# Patient Record
Sex: Female | Born: 1950
Health system: Southern US, Community
[De-identification: ages and names within clinical notes are randomized; demographics above are authoritative.]

## PROBLEM LIST (undated history)

## (undated) DIAGNOSIS — M1990 Unspecified osteoarthritis, unspecified site: Secondary | ICD-10-CM

## (undated) DIAGNOSIS — G8929 Other chronic pain: Secondary | ICD-10-CM

## (undated) DIAGNOSIS — S1980XA Other specified injuries of unspecified part of neck, initial encounter: Secondary | ICD-10-CM

## (undated) DIAGNOSIS — G5600 Carpal tunnel syndrome, unspecified upper limb: Secondary | ICD-10-CM

## (undated) DIAGNOSIS — R87619 Unspecified abnormal cytological findings in specimens from cervix uteri: Secondary | ICD-10-CM

## (undated) DIAGNOSIS — R251 Tremor, unspecified: Secondary | ICD-10-CM

## (undated) DIAGNOSIS — I10 Essential (primary) hypertension: Secondary | ICD-10-CM

## (undated) DIAGNOSIS — R000 Tachycardia, unspecified: Secondary | ICD-10-CM

## (undated) DIAGNOSIS — Z8619 Personal history of other infectious and parasitic diseases: Secondary | ICD-10-CM

## (undated) DIAGNOSIS — R609 Edema, unspecified: Secondary | ICD-10-CM

## (undated) DIAGNOSIS — F419 Anxiety disorder, unspecified: Secondary | ICD-10-CM

## (undated) DIAGNOSIS — M5126 Other intervertebral disc displacement, lumbar region: Secondary | ICD-10-CM

## (undated) DIAGNOSIS — IMO0002 Reserved for concepts with insufficient information to code with codable children: Secondary | ICD-10-CM

## (undated) DIAGNOSIS — K219 Gastro-esophageal reflux disease without esophagitis: Secondary | ICD-10-CM

## (undated) HISTORY — PX: UPPER GI ENDOSCOPY: SHX6162

## (undated) HISTORY — DX: Other specified injuries of unspecified part of neck, initial encounter: S19.80XA

## (undated) HISTORY — DX: Other chronic pain: G89.29

## (undated) HISTORY — PX: COLONOSCOPY: SHX174

## (undated) HISTORY — DX: Essential (primary) hypertension: I10

## (undated) HISTORY — DX: Carpal tunnel syndrome, unspecified upper limb: G56.00

## (undated) HISTORY — PX: OTHER SURGICAL HISTORY: SHX169

## (undated) HISTORY — DX: Personal history of other infectious and parasitic diseases: Z86.19

## (undated) HISTORY — DX: Unspecified abnormal cytological findings in specimens from cervix uteri: R87.619

## (undated) HISTORY — DX: Tachycardia, unspecified: R00.0

## (undated) HISTORY — DX: Edema, unspecified: R60.9

## (undated) HISTORY — DX: Reserved for concepts with insufficient information to code with codable children: IMO0002

## (undated) HISTORY — DX: Other intervertebral disc displacement, lumbar region: M51.26

---

## 2000-05-02 ENCOUNTER — Other Ambulatory Visit: Admission: RE | Admit: 2000-05-02 | Discharge: 2000-05-02 | Payer: Self-pay | Admitting: Gynecology

## 2000-11-21 ENCOUNTER — Emergency Department (HOSPITAL_COMMUNITY): Admission: EM | Admit: 2000-11-21 | Discharge: 2000-11-21 | Payer: Self-pay | Admitting: Emergency Medicine

## 2001-01-16 ENCOUNTER — Emergency Department (HOSPITAL_COMMUNITY): Admission: EM | Admit: 2001-01-16 | Discharge: 2001-01-16 | Payer: Self-pay | Admitting: Emergency Medicine

## 2001-01-30 ENCOUNTER — Emergency Department (HOSPITAL_COMMUNITY): Admission: EM | Admit: 2001-01-30 | Discharge: 2001-01-30 | Payer: Self-pay | Admitting: Emergency Medicine

## 2001-01-30 ENCOUNTER — Encounter: Payer: Self-pay | Admitting: Emergency Medicine

## 2001-02-06 ENCOUNTER — Other Ambulatory Visit: Admission: RE | Admit: 2001-02-06 | Discharge: 2001-02-06 | Payer: Self-pay | Admitting: Gastroenterology

## 2001-02-06 ENCOUNTER — Encounter (INDEPENDENT_AMBULATORY_CARE_PROVIDER_SITE_OTHER): Payer: Self-pay | Admitting: Specialist

## 2001-04-02 ENCOUNTER — Encounter: Payer: Self-pay | Admitting: Gastroenterology

## 2001-04-02 ENCOUNTER — Ambulatory Visit (HOSPITAL_COMMUNITY): Admission: RE | Admit: 2001-04-02 | Discharge: 2001-04-02 | Payer: Self-pay | Admitting: Gastroenterology

## 2001-04-16 ENCOUNTER — Encounter: Payer: Self-pay | Admitting: Psychology

## 2001-04-16 ENCOUNTER — Inpatient Hospital Stay (HOSPITAL_COMMUNITY): Admission: EM | Admit: 2001-04-16 | Discharge: 2001-04-17 | Payer: Self-pay | Admitting: Emergency Medicine

## 2001-05-20 ENCOUNTER — Emergency Department (HOSPITAL_COMMUNITY): Admission: EM | Admit: 2001-05-20 | Discharge: 2001-05-20 | Payer: Self-pay | Admitting: Internal Medicine

## 2001-05-20 ENCOUNTER — Encounter: Payer: Self-pay | Admitting: Gastroenterology

## 2002-08-28 ENCOUNTER — Other Ambulatory Visit: Admission: RE | Admit: 2002-08-28 | Discharge: 2002-08-28 | Payer: Self-pay | Admitting: Obstetrics & Gynecology

## 2003-09-23 ENCOUNTER — Other Ambulatory Visit: Admission: RE | Admit: 2003-09-23 | Discharge: 2003-09-23 | Payer: Self-pay | Admitting: Obstetrics and Gynecology

## 2004-05-27 ENCOUNTER — Emergency Department (HOSPITAL_COMMUNITY): Admission: EM | Admit: 2004-05-27 | Discharge: 2004-05-28 | Payer: Self-pay | Admitting: Emergency Medicine

## 2004-10-14 ENCOUNTER — Other Ambulatory Visit: Admission: RE | Admit: 2004-10-14 | Discharge: 2004-10-14 | Payer: Self-pay | Admitting: Obstetrics & Gynecology

## 2005-05-16 ENCOUNTER — Ambulatory Visit: Payer: Self-pay | Admitting: Gastroenterology

## 2005-11-02 ENCOUNTER — Other Ambulatory Visit: Admission: RE | Admit: 2005-11-02 | Discharge: 2005-11-02 | Payer: Self-pay | Admitting: Obstetrics and Gynecology

## 2006-09-27 ENCOUNTER — Emergency Department (HOSPITAL_COMMUNITY): Admission: EM | Admit: 2006-09-27 | Discharge: 2006-09-27 | Payer: Self-pay | Admitting: Emergency Medicine

## 2006-11-05 ENCOUNTER — Emergency Department (HOSPITAL_COMMUNITY): Admission: EM | Admit: 2006-11-05 | Discharge: 2006-11-05 | Payer: Self-pay | Admitting: Emergency Medicine

## 2006-12-10 ENCOUNTER — Encounter: Admission: RE | Admit: 2006-12-10 | Discharge: 2007-03-10 | Payer: Self-pay | Admitting: Family Medicine

## 2007-09-12 ENCOUNTER — Encounter: Payer: Self-pay | Admitting: Internal Medicine

## 2007-10-15 ENCOUNTER — Ambulatory Visit: Payer: Self-pay | Admitting: Internal Medicine

## 2007-10-15 DIAGNOSIS — R635 Abnormal weight gain: Secondary | ICD-10-CM | POA: Insufficient documentation

## 2008-01-18 ENCOUNTER — Emergency Department (HOSPITAL_COMMUNITY): Admission: EM | Admit: 2008-01-18 | Discharge: 2008-01-18 | Payer: Self-pay | Admitting: Emergency Medicine

## 2008-06-26 ENCOUNTER — Emergency Department (HOSPITAL_COMMUNITY): Admission: EM | Admit: 2008-06-26 | Discharge: 2008-06-26 | Payer: Self-pay | Admitting: Emergency Medicine

## 2010-06-04 ENCOUNTER — Emergency Department (HOSPITAL_COMMUNITY): Admission: EM | Admit: 2010-06-04 | Discharge: 2010-06-04 | Payer: Self-pay | Admitting: Family Medicine

## 2010-06-04 ENCOUNTER — Emergency Department (HOSPITAL_COMMUNITY): Admission: EM | Admit: 2010-06-04 | Discharge: 2010-06-05 | Payer: Self-pay | Admitting: Emergency Medicine

## 2011-01-15 ENCOUNTER — Encounter: Payer: Self-pay | Admitting: Neurosurgery

## 2011-01-24 NOTE — Assessment & Plan Note (Signed)
Summary: New Pt/Abd Swelling/Edema/Elevated Bld Cts/ KDW     Past Medical History:    Referred by Dr. Miguel Aschoff for evaluation of lymphocytosis. She has seen Dr. Tenny Craw, Dr. [place] (Cardiology), Dr. Alwyn Ren, Dr. Reggy Eye (10 times between 06/26/07 and 10/15/07), Rheumatology, and Neurology recently. She has had diffuse aching pains, with transient abdominal bloating and pedal edema. She has not had any fevers, chills, or sweats. She has gained weight (105-148 pounds). She was recently treated with lasix and prednisone and the edema improved. She still has pain (?chronic). She believes she has a "circulation" problem. Has been dx with R carpal tunnel syndrome. Has been on SSRIs in past for anxiety and narcotics for pain.     Risk Factors:  Tobacco use:  never    Vital Signs:  Patient Profile:   60 Years Old Female Weight:      148.13 pounds (67.33 kg) Temp:     98.9 degrees F (37.17 degrees C) oral Pulse rate:   82 / minute BP sitting:   108 / 65  Pt. in pain?   yes    Location:   abdomen    Intensity:   6    Type:       aching  Vitals Entered By: Starleen Arms (October 15, 2007 12:01 PM)              Is Patient Diabetic? No  Does patient need assistance? Functional Status Self care Ambulation Normal     Physical Exam  General:     alert and overweight-appearing.   Mouth:     pharynx pink and moist.   Lungs:     normal breath sounds.   Heart:     normal rate, regular rhythm, and no murmur.   Abdomen:     soft, non-tender, no masses, no hepatomegaly, no splenomegaly, and distended.   Extremities:     no edema Cervical Nodes:     no anterior cervical adenopathy and no posterior cervical adenopathy.   Axillary Nodes:     no R axillary adenopathy and no L axillary adenopathy.      Impression & Recommendations:  Problem # 1:  WEIGHT GAIN, ABNORMAL (ICD-783.1) Infection is very unlikely given weight gain and lack of other signs or symptoms of infection. I  agreed to review Dr. Netta Corrigan records. I do not recommend any further evaluation for infection at this time. Orders: Consultation Level III ([id number])   Problem # 2:  PEDAL EDEMA (ICD-782.3) Resolved. Orders: Consultation Level III [id number])  Her updated medication list for this problem includes:    Spironolactone 50 Mg Tabs (Spironolactone)    Furosemide 40 Mg Tabs (Furosemide)   Medications Added to Medication List This Visit: 1)  Caltrate 600+d 600-400 Mg-unit Tabs (Calcium carbonate-vitamin d) .Marland Kitchen.. 1 daily 2)  Citalopram Hydrobromide 10 Mg Tabs (Citalopram hydrobromide) 3)  Nexium 40 Mg Cpdr (Esomeprazole magnesium) 4)  Medroxyprogesterone Acetate 2.5 Mg Tabs (Medroxyprogesterone acetate) 5)  Estradiol 1 Mg Tabs (Estradiol) 6)  Prednisone 10 Mg Tabs (Prednisone) 7)  Clonazepam 1 Mg Tabs (Clonazepam) 8)  Spironolactone 50 Mg Tabs (Spironolactone) 9)  Furosemide 40 Mg Tabs (Furosemide)     ]  Appended Document: New Pt/Abd Swelling/Edema/Elevated Bld Cts/ KDW I reviewed Dr. Netta Corrigan records and find no evidence for infection.

## 2011-01-24 NOTE — Miscellaneous (Signed)
Summary: Release of Information Form  Release of Information Form   Imported By: America Brown 10/18/2007 16:17:43  _____________________________________________________________________  External Attachment:    Type:   Image     Comment:   External Document

## 2011-01-24 NOTE — Letter (Signed)
Summary: [facility]  [facility]   Imported By: Randon Goldsmith 01/27/2008 11:23:09  _____________________________________________________________________  External Attachment:    Type:   Image     Comment:   External Document

## 2011-03-13 LAB — CBC
HCT: 34.4 % — ABNORMAL LOW (ref 36.0–46.0)
Hemoglobin: 12.2 g/dL (ref 12.0–15.0)
MCHC: 35.5 g/dL (ref 30.0–36.0)
MCV: 98.3 fL (ref 78.0–100.0)
Platelets: 211 10*3/uL (ref 150–400)
RBC: 3.51 MIL/uL — ABNORMAL LOW (ref 3.87–5.11)
RDW: 12.9 % (ref 11.5–15.5)
WBC: 6.6 10*3/uL (ref 4.0–10.5)

## 2011-03-13 LAB — COMPREHENSIVE METABOLIC PANEL
ALT: 17 U/L (ref 0–35)
AST: 23 U/L (ref 0–37)
Albumin: 3.7 g/dL (ref 3.5–5.2)
Alkaline Phosphatase: 56 U/L (ref 39–117)
BUN: 6 mg/dL (ref 6–23)
CO2: 24 mEq/L (ref 19–32)
Calcium: 8.8 mg/dL (ref 8.4–10.5)
Chloride: 102 mEq/L (ref 96–112)
Creatinine, Ser: 0.88 mg/dL (ref 0.4–1.2)
GFR calc Af Amer: >60 mL/min (ref >60)
GFR calc non Af Amer: >60 mL/min (ref >60)
Glucose, Bld: 98 mg/dL (ref 70–99)
Potassium: 3.8 mEq/L (ref 3.5–5.1)
Sodium: 134 mEq/L — ABNORMAL LOW (ref 135–145)
Total Bilirubin: 0.4 mg/dL (ref 0.3–1.2)
Total Protein: 6.6 g/dL (ref 6.0–8.3)

## 2011-03-13 LAB — URINE MICROSCOPIC-ADD ON

## 2011-03-13 LAB — URINALYSIS, ROUTINE W REFLEX MICROSCOPIC
Bilirubin Urine: NEGATIVE
Glucose, UA: NEGATIVE mg/dL
Ketones, ur: NEGATIVE mg/dL
Nitrite: NEGATIVE
Protein, ur: NEGATIVE mg/dL
Specific Gravity, Urine: 1.022 (ref 1.005–1.030)
Urobilinogen, UA: 0.2 mg/dL (ref 0.0–1.0)
pH: 6 (ref 5.0–8.0)

## 2011-03-13 LAB — URINE CULTURE
Colony Count: NO GROWTH
Culture: NO GROWTH

## 2011-03-13 LAB — BRAIN NATRIURETIC PEPTIDE: Pro B Natriuretic peptide (BNP): 47 pg/mL (ref 0.0–100.0)

## 2011-05-12 NOTE — Discharge Summary (Signed)
[facility]  Patient:    Carmen Rasmussen, Carmen Rasmussen                MRN: [id number] Adm. Date:  [id number] Disc. Date: 04/17/01 Attending:  Molpus, John L Dictator:   Mike Gip, P.A. CC:         Feliciana Rossetti, M.D.   Discharge Summary  ADMISSION DIAGNOSES: 20. A 60 year old white female with nausea, vomiting, and persistent weight    loss with documented gastroparesis.  Rule out all symptoms secondary to    idiopathic gastroparesis.  Rule out underlying obstruction, chronic    pancreatitis, and malignancy. 2. Chronic ETOH use. 3. Anxiety, question depression. 4. Remote cesarean section.  DISCHARGE DIAGNOSES: 1. Severe gastroparesis, presumed idiopathic. 2. Anxiety. 3. Question depression.  CONSULTATIONS:  None.  PROCEDURES:  Plain abdominal films and CT scan of the abdomen and pelvis.  HISTORY OF PRESENT ILLNESS:  Carmen Rasmussen is a 60 year old white female previously in good health status post remote C section with onset of her current symptoms in July 2001, with complaints of nausea and abdominal discomfort as well as weight loss.  Patient has had a gradual weight loss of at least 35 pounds since that time and progressive difficulty with anorexia, nausea, and inability to eat.  This has progressed to intermittent vomiting and complaints of burning epigastric discomfort.  She has had several emergency room visits over the past several months with nausea and vomiting.  She was initially referred to Dr. Eloise Harman in February 2002.  She has undergone upper endoscopy which showed chronic gastritis.  Biopsies of the small bowel were negative. Abdominal ultrasound was normal.  Colonoscopy was normal including biopsies of the terminal ileum.  Labs including CBC, hepatic function panel, amylase, lipase, and TSH were all unremarkable.  Gastric emptying scan was done about two weeks ago showing 100% retention at two hours.  The patient was started on Reglan 5 mg  q.i.d. which she has not been taking regularly.  She had also been started on Zoloft and then trazodone was added to this regimen per Dr. Eloise Harman.  She had also been given a prescription for Zyprexa for Dr. Quintella Reichert and is not taking any of these medications regularly at this time. She presents to the emergency room with complaints of nausea and vomiting and continued weight loss, and is admitted for further diagnostic evaluation and hydration.  LABORATORY STUDIES ON ADMISSION:  WBC 7.9, hemoglobin 12.7, hematocrit 37.4, MCV 98, platelets 313.  Electrolytes within normal limits.  Liver function studies normal, sed rate 14, amylase 53, lipase 16.  ETOH level less than 5, TSH of 0.527.  A drug screen and heavy metal screen are still pending at this time.  A CT scan of the abdomen and pelvis read as entirely normal with the exception of a right ovarian cyst 2.5 cm.  HOSPITAL COURSE:   Patient was admitted overnight to the service of Dr. Yancey Flemings, placed on IV fluids, liquid diet, IV Reglan, Protonix, and Phenergan. She underwent lab evaluation which, again, is all unremarkable.  Drug screen and heavy metal screen were ordered but are pending at the time of this dictation.  She had CT scan of the abdomen and pelvis to rule out underlying pancreatic problem or bowel obstruction.  This was entirely negative.  It also did not show any dilation of her stomach.  The following morning, patient was feeling somewhat better and had been quite anxious from the start to be discharged from the hospital.  I had a long discussion with the patient regarding her diagnosis and the fact that this was going to take some ongoing effort on her part to eat small frequent meals, to start with a full liquid diet, and gradually advance herself to a low-residue diet.  She is also to drink Ensure two to three cans per day.  I advised her that repeated emergency room visits were not necessary at this point.  She is  reluctant to be on any antidepressant and advised her that, if she chooses, should continue the Zoloft 50 mg p.o. q.h.s.  Also advised that she seek counseling for generalized anxiety; however, she does not feel that she can afford this as her insurance does not cover.  She is willing to retry the Reglan and will try at 10 mg p.o. one half hour a.c. and h.s.  Also gave her a prescription for Phenergan 25 mg q.6-8h. p.r.n. nausea, and she is to resume Nexium which she had run out of at 40 mg p.o. q.a.m.  She has a return office visit with Dr. Eloise Harman at May 10 at 10:45 a.m.  She may ultimately require a referral to [facility] or [facility] if she is unresponsive to the metoclopramide.  CONDITION ON DISCHARGE:  Stable. DD:  04/17/01 TD:  04/17/01 Job: [id number]

## 2011-05-12 NOTE — H&P (Signed)
[facility]  Patient:    Carmen Rasmussen                MRN: [id number] Adm. Date:  [id number] Attending:  Molpus, John L Dictator:   Mike Gip, P.A.-C. CC:         Lacretia Leigh. Quintella Reichert, M.D.   History and Physical  CHIEF COMPLAINT:  Nausea, vomiting, and weight loss.  HISTORY OF PRESENT ILLNESS:  Carmen Rasmussen is a 60 year old white female previously in good health with remote history of c-section. The patient had onset of her current problems in July of 2001 with nausea and abdominal discomfort, as well as weight loss. She has had a gradual weight loss of approximately 35 pounds since that time. She complains of progressive difficulty with anorexia, nausea, and inability to eat. This has now progressed to intermittent vomiting and complaints of burning mid abdominal pain. She complains of feeling weak and dizzy at times. She has had several prior emergency room visits over the past several months with complaints of nausea and vomiting. She was referred to Dr. Jarold Motto in February of 2002 and initially suspected underlying ETOH liver disease and significant anxiety. However, her labs were all unremarkable at that time with the exception of a hemoglobin of 12.1. She has since undergone abdominal ultrasound which was normal, upper endoscopy which did show chronic gastritis. Biopsies of the small bowel were done, as well, which were negative. Colonoscopy was also normal, including biopsies of the terminal ileum. Gastric emptying scan was most recently done on April 02, 2001, showing 100% retention at 2 hours. The patient was started on Reglan 5 mg p.o. q.i.d. She has also been started on Zoloft through Dr. Jarold Motto and then trazodone was added to this regimen more recently. She is also being followed by Tinnie Gens C. Quintella Reichert, M.D. and was given a prescription for Zyprexa which she did not start. She says she has not been taking any of the antidepressants  regularly and does not feel that she needs an antidepressant and did not feel that they were helping her. At this time, she presents to the emergency room with complaints of nausea and vomiting, continued weight loss, and is quite frustrated. She is admitted at this time for further diagnostic evaluation and hydration. She does admit to drinking a couple of drinks per day but not every day. She does not feel that depression is her problem but does not admit to anxiety.  CURRENT MEDICATIONS: 1. Zoloft 50 p.o. q.d., not taking regularly. 2. Zyprexa 2.5, not taking. 3. Reglan 5 mg q.i.d., not taking regularly. 4. Nexium 40 mg p.o. q.a.m., ran out. 5. Phenergan 25 q.6h. p.r.n. 6. ______ p.r.n.  ALLERGIES:  No known drug allergies.  PAST MEDICAL HISTORY:  As outlined above.  FAMILY HISTORY:  Father deceased with cirrhosis and liver cancer. One aunt with colon cancer. Father also with insulin-dependent diabetes mellitus. Mother with adult-onset diabetes mellitus.  SOCIAL HISTORY:  The patient is married, last year, her second marriage. She has a teenage child who is not living with her since she remarried, and this is quite stressful for her. No tobacco. She does drink alcohol, a few per day but not every day. Is employed full-time. Her husband was admitted within the past year with ETOH-related pancreatitis.  REVIEW OF SYSTEMS:  CARDIOVASCULAR:  Denies any chest pain or anginal symptoms. PULMONARY:  Denies any cough, shortness of breath, or sputum production. GENITOURINARY:  Denies any dysuria, urgency, or frequency. GI:  As above.  PHYSICAL EXAMINATION:  GENERAL:  Well-developed, chronically ill, cachectic-appearing, white female in no acute distress.  VITAL SIGNS:  Blood pressure 104/68, pulse is 94, temperature is 97, respirations 18.  HEENT:  Atraumatic, normocephalic. EOMI. PERRLA. Sclerae anicteric. Buccal mucosa is dry.  PULMONARY:  Clear to A&P.  CARDIOVASCULAR:   Tachy regular rhythm with S1 and S2.  ABDOMEN:  Full feeling in the epigastrium. Question of ascites. There is no definite succussion splash. No definite mass or hepatosplenomegaly. Bowel sounds are active.  RECTAL:  Heme negative previously and not repeated.  EXTREMITIES:  The patient is very thin. No clubbing, cyanosis, or edema and no skin lesions.  LABORATORY DATA:  Pending on admission.  IMPRESSION: 1. A 60 year old white female with nausea, vomiting, and persistent weight    loss with documented gastroparesis. Rule out idiopathic gastroparesis. Rule    out secondary problem secondary with underlying obstruction, chronic    pancreatitis, or malignancy. 2. Question ascites. 3. Chronic ETOH use. 4. Anxiety and question depression. 5. Remote c-section.  PLAN:  The patient is admitted to the service of Wilhemina Bonito. Eda Keys., M.D. who is covering the hospital. She will be hydrated, placed on IV antiemetics, IV Protonix and IV Reglan. We will run baseline labs, drug screen, and a heavy metal screen. We will check alcohol level, as well. Check plain abdominal films and then CT scan of the abdomen and pelvis. For details, please see the orders. DD:  04/17/01 TD:  04/17/01 Job: [id number]

## 2011-05-15 ENCOUNTER — Encounter: Payer: Self-pay | Admitting: Cardiology

## 2011-05-15 DIAGNOSIS — G5600 Carpal tunnel syndrome, unspecified upper limb: Secondary | ICD-10-CM | POA: Insufficient documentation

## 2011-05-15 DIAGNOSIS — Z8619 Personal history of other infectious and parasitic diseases: Secondary | ICD-10-CM | POA: Insufficient documentation

## 2011-05-15 DIAGNOSIS — R609 Edema, unspecified: Secondary | ICD-10-CM | POA: Insufficient documentation

## 2011-05-17 ENCOUNTER — Ambulatory Visit: Payer: Self-pay | Admitting: Cardiology

## 2011-06-16 ENCOUNTER — Encounter: Payer: Self-pay | Admitting: Cardiology

## 2011-06-16 ENCOUNTER — Ambulatory Visit (INDEPENDENT_AMBULATORY_CARE_PROVIDER_SITE_OTHER): Payer: Self-pay | Admitting: Cardiology

## 2011-06-16 VITALS — BP 102/72 | HR 93 | Ht 65.0 in | Wt 121.0 lb

## 2011-06-16 DIAGNOSIS — R609 Edema, unspecified: Secondary | ICD-10-CM

## 2011-06-16 DIAGNOSIS — R011 Cardiac murmur, unspecified: Secondary | ICD-10-CM

## 2011-06-16 DIAGNOSIS — R000 Tachycardia, unspecified: Secondary | ICD-10-CM

## 2011-06-16 DIAGNOSIS — R002 Palpitations: Secondary | ICD-10-CM

## 2011-06-16 NOTE — Patient Instructions (Signed)
We will have you wear a monitor for 24 hours.  We will repeat your Echocardiogram.  We will call you with these results.

## 2011-06-17 DIAGNOSIS — R002 Palpitations: Secondary | ICD-10-CM | POA: Insufficient documentation

## 2011-06-17 NOTE — Assessment & Plan Note (Signed)
She reports that her palpitations are occurring on a daily basis. We will have her wear an event monitor to see if she is having any significant arrhythmia. She does appear to be very anxious and the symptoms may be related to her anxiety. We will also repeat an echocardiogram to rule out significant structural heart disease.

## 2011-06-17 NOTE — Assessment & Plan Note (Signed)
She has had extensive prior evaluation for her edema. This includes evaluation of her hepatic function, renal function, and cardiac function. All studies in the past have been unremarkable. 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 The mainstay of therapy is sodium restriction. This tends to occur in women and is often associated with eating disorders or other neurotic-type symptoms. Given her extensive evaluation in the past I do not feel that further workup is indicated.

## 2011-06-17 NOTE — Progress Notes (Signed)
Jiles Harold Date of Birth: 29-Oct-1951   History of Present Illness: Carmen Rasmussen is a pleasant 60 year old white female is seen the request of Dr. Merla Riches for evaluation of palpitations. She was evaluated here in 2008 for recurrent episodes of edema. She had extensive cardiac and rheumatologic evaluation at that time which were really unremarkable. She had an echocardiogram which demonstrated mild concentric LVH with normal systolic function. There was mild mitral insufficiency. There is also mild to increase in velocity across aortic valve with normal appearing valve. More recently she has been experiencing symptoms of her heart racing at night. Associated with this her arms and legs hurt and she states it feels like electricity is going throughout her body. She then reports that she shakes and jerks. The symptoms have been worse over the past 4 months. There are no clear triggers. She has had no dizziness or syncope. She denies any shortness of breath. She has had no chest pain.  Current Outpatient Prescriptions on File Prior to Visit  Medication Sig Dispense Refill  . ClonazePAM (KLONOPIN PO) Take 10 mg by mouth as needed.       Marland Kitchen estradiol (ESTRACE) 1 MG tablet Take 1 mg by mouth daily.        Marland Kitchen HYDROcodone-acetaminophen (NORCO) 10-325 MG per tablet Take 1 tablet by mouth daily.        . medroxyPROGESTERone (PROVERA) 2.5 MG tablet Take 2.5 mg by mouth daily.        . metoprolol (LOPRESSOR) 50 MG tablet Take 50 mg by mouth daily.          No Known Allergies  Past Medical History  Diagnosis Date  . Carpal tunnel syndrome   . Edema   . History of shingles   . Tachycardia   . Hyperextension injury of cervical spine     Past Surgical History  Procedure Date  . Cesarean section     History  Smoking status  . Never Smoker   Smokeless tobacco  . Not on file    History  Alcohol Use No    Family History  Problem Relation Age of Onset  . Asthma Mother   . Thyroid disease  Mother   . Diabetes Father   . Colon cancer Sister   . Hypertension Brother   . Thyroid disease Sister   . Asthma Sister     Review of Systems: The review of systems is positive for periodic bouts of fairly marked lower extremity edema. There is no clear trigger. These episodes may last for several days and then resolve spontaneously. She has no associated shortness of breath. During these times her weight may fluctuate as much is 10 pounds a day. In her records I see that she had a CT of the abdomen and pelvis in 2009 which was normal. All other systems were reviewed and are negative.  Physical Exam: BP 102/72  Pulse 93  Ht 5\' 5"  (1.651 m)  Wt 121 lb (54.885 kg)  BMI 20.14 kg/m2 She is a pleasant but anxious white female in no acute distress. She normocephalic, atraumatic. Pupils equal round react to light and accommodation. Extraocular movements are full. Her affect is clear. Neck is supple without JVD, adenopathy, thyromegaly, or bruits. Lungs are clear. Cardiac exam reveals a regular rate and rhythm. There is a soft grade 1-2/6 systolic ejection murmur heard at the upper sternal regions. There is no gallop or rub. PMI is normal. Abdomen is soft nontender without hepatosplenomegaly or bruits. Also  has a positive. She has no lower extremity edema. Pulses are 2+ and symmetric. Skin is warm and dry. She is alert and oriented x3. Cranial nerves II through XII are intact. LABORATORY DATA: ECG demonstrates normal sinus rhythm with a normal ECG.  Assessment / Plan:

## 2011-06-19 ENCOUNTER — Encounter: Payer: Self-pay | Admitting: Cardiology

## 2011-06-20 ENCOUNTER — Other Ambulatory Visit (HOSPITAL_COMMUNITY): Payer: Self-pay

## 2011-06-20 DIAGNOSIS — I495 Sick sinus syndrome: Secondary | ICD-10-CM

## 2011-06-22 ENCOUNTER — Telehealth: Payer: Self-pay | Admitting: Cardiology

## 2011-06-22 NOTE — Telephone Encounter (Signed)
Pt called about heart monitor results She said she postponed echo until July 2 please call

## 2011-06-22 NOTE — Telephone Encounter (Signed)
Called w/ results of holter monitor. Waiting on Echo. She states she had to reschedule to 7/2. Will call her w/ results and then schedule an app to come back in to office.

## 2011-06-26 ENCOUNTER — Ambulatory Visit (HOSPITAL_COMMUNITY): Payer: Self-pay | Attending: Cardiology | Admitting: Radiology

## 2011-06-26 DIAGNOSIS — R000 Tachycardia, unspecified: Secondary | ICD-10-CM | POA: Insufficient documentation

## 2011-06-26 DIAGNOSIS — R011 Cardiac murmur, unspecified: Secondary | ICD-10-CM

## 2011-06-26 DIAGNOSIS — I080 Rheumatic disorders of both mitral and aortic valves: Secondary | ICD-10-CM | POA: Insufficient documentation

## 2011-06-26 DIAGNOSIS — R002 Palpitations: Secondary | ICD-10-CM | POA: Insufficient documentation

## 2011-06-26 DIAGNOSIS — I379 Nonrheumatic pulmonary valve disorder, unspecified: Secondary | ICD-10-CM | POA: Insufficient documentation

## 2011-06-29 ENCOUNTER — Telehealth: Payer: Self-pay | Admitting: *Deleted

## 2011-06-29 NOTE — Telephone Encounter (Signed)
Message copied by Lorayne Bender on Thu Jun 29, 2011  4:16 PM ------      Message from: [place], PETER M      Created: Tue Jun 27, 2011  3:57 PM       Echo looks good. Please report.

## 2011-07-03 ENCOUNTER — Telehealth: Payer: Self-pay | Admitting: *Deleted

## 2011-07-03 NOTE — Telephone Encounter (Signed)
Notified of Echo results. Per Dr. [place]and will need to see back to discuss results of Echo and monitor.

## 2011-07-03 NOTE — Telephone Encounter (Signed)
Message copied by Lorayne Bender on Mon Jul 03, 2011  8:44 AM ------      Message from: [place], PETER M      Created: Tue Jun 27, 2011  3:57 PM       Echo looks good. Please report.

## 2011-07-07 ENCOUNTER — Encounter: Payer: Self-pay | Admitting: Cardiology

## 2011-07-07 ENCOUNTER — Ambulatory Visit (INDEPENDENT_AMBULATORY_CARE_PROVIDER_SITE_OTHER): Payer: Self-pay | Admitting: Cardiology

## 2011-07-07 VITALS — BP 118/74 | HR 68 | Ht 66.5 in | Wt 119.0 lb

## 2011-07-07 DIAGNOSIS — R002 Palpitations: Secondary | ICD-10-CM

## 2011-07-07 DIAGNOSIS — R609 Edema, unspecified: Secondary | ICD-10-CM

## 2011-07-07 DIAGNOSIS — R000 Tachycardia, unspecified: Secondary | ICD-10-CM

## 2011-07-07 NOTE — Assessment & Plan Note (Addendum)
She has no significant arrhythmia noted on 24-hour Holter monitoring. I recommend that she avoid cardiac stimulants. She does have metoprolol that she can take on a p.r.n. basis for sustained tachycardia. No further evaluation is warranted. Her echocardiogram was normal. I will see her back on a p.r.n. basis.

## 2011-07-07 NOTE — Assessment & Plan Note (Signed)
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 This is best managed with sodium restriction. I given her literature to review concerning this condition.

## 2011-07-07 NOTE — Patient Instructions (Signed)
You may take Metoprolol as needed for your heart racing.  You have idiopathic edema. This is best managed by salt restriction.  I will see you back as needed.

## 2011-07-07 NOTE — Progress Notes (Signed)
   Carmen Rasmussen Date of Birth: November 23, 1951   History of Present Illness: Carmen Rasmussen is seen today for followup of her palpitations. We did have her wear a 24-hour Holter monitor. She states she did have her usual symptoms during this time. This demonstrated rare PACs. She did have sinus arrhythmia. There were no significant arrhythmias to correlate with her symptoms.  Current Outpatient Prescriptions on File Prior to Visit  Medication Sig Dispense Refill  . ClonazePAM (KLONOPIN PO) Take 10 mg by mouth as needed.       Marland Kitchen estradiol (ESTRACE) 1 MG tablet Take 1 mg by mouth daily.        Marland Kitchen HYDROcodone-acetaminophen (NORCO) 10-325 MG per tablet Take 1-2 tablets by mouth daily.       Marland Kitchen ibuprofen (ADVIL,MOTRIN) 800 MG tablet Take 800 mg by mouth every 8 (eight) hours as needed.        . medroxyPROGESTERone (PROVERA) 2.5 MG tablet Take 2.5 mg by mouth daily.        . metoprolol (LOPRESSOR) 50 MG tablet Take 50 mg by mouth daily.          No Known Allergies  Past Medical History  Diagnosis Date  . Carpal tunnel syndrome   . Edema   . History of shingles   . Tachycardia   . Hyperextension injury of cervical spine     Past Surgical History  Procedure Date  . Cesarean section     History  Smoking status  . Never Smoker   Smokeless tobacco  . Not on file    History  Alcohol Use No    Family History  Problem Relation Age of Onset  . Asthma Mother   . Thyroid disease Mother   . Diabetes Father   . Colon cancer Sister   . Hypertension Brother   . Thyroid disease Sister   . Asthma Sister     Review of Systems: The review of systems is positive for periodic bouts of fairly marked lower extremity edema. There is no clear trigger. These episodes may last for several days and then resolve spontaneously. She has no associated shortness of breath. During these times her weight may fluctuate as much is 10 pounds a day. In her records I see that she had a CT of the abdomen and pelvis in  2009 which was normal. All other systems were reviewed and are negative.  Physical Exam: BP 118/74  Pulse 68  Ht 5' 6.5" (1.689 m)  Wt 119 lb (53.978 kg)  BMI 18.92 kg/m2 She is a pleasant but anxious white female in no acute distress. She normocephalic, atraumatic. Pupils equal round react to light and accommodation. Extraocular movements are full. Her affect is clear. Neck is supple without JVD, adenopathy, thyromegaly, or bruits. Lungs are clear. Cardiac exam reveals a regular rate and rhythm. There is a soft grade 1-2/6 systolic ejection murmur heard at the upper sternal regions. There is no gallop or rub. PMI is normal. Abdomen is soft nontender without hepatosplenomegaly or bruits. Also has a positive. She has no lower extremity edema. Pulses are 2+ and symmetric. Skin is warm and dry. She is alert and oriented x3. Cranial nerves II through XII are intact. LABORATORY DATA: ECG demonstrates normal sinus rhythm with a normal ECG.  Assessment / Plan:

## 2011-07-10 ENCOUNTER — Encounter: Payer: Self-pay | Admitting: Cardiology

## 2011-07-11 ENCOUNTER — Telehealth: Payer: Self-pay | Admitting: *Deleted

## 2011-07-11 DIAGNOSIS — R002 Palpitations: Secondary | ICD-10-CM

## 2011-07-11 NOTE — Telephone Encounter (Signed)
Called stating she had "severe" episode of heart racing last PM. For 10 hrs straight heart was racing even after taking metoprolol. She states she couldn't even get out of bed. Called MD on call for [place]- a [facility] resident- called her back. He told her to take extra dose of Metoprolol which she did but she did not slow down. Wants to know what else to do. Spoke w/ Dr. [place]. He wants to put 30 day event monitor on her. She will come in tomorrow for the monitor. Once we can document HR we can better treat her.

## 2011-07-12 ENCOUNTER — Encounter: Payer: Self-pay | Admitting: *Deleted

## 2011-09-21 LAB — URINE MICROSCOPIC-ADD ON

## 2011-09-21 LAB — DIFFERENTIAL
Basophils Absolute: 0.1
Basophils Relative: 1
Eosinophils Absolute: 0
Eosinophils Relative: 1
Lymphocytes Relative: 36
Lymphs Abs: 1.8
Monocytes Absolute: 0.5
Monocytes Relative: 10
Neutro Abs: 2.5
Neutrophils Relative %: 52

## 2011-09-21 LAB — COMPREHENSIVE METABOLIC PANEL
ALT: 76 — ABNORMAL HIGH
AST: 68 — ABNORMAL HIGH
Albumin: 4.1
Alkaline Phosphatase: 95
BUN: 7
CO2: 29
Calcium: 9.9
Chloride: 102
Creatinine, Ser: 0.84
GFR calc Af Amer: >60
GFR calc non Af Amer: >60
Glucose, Bld: 100 — ABNORMAL HIGH
Potassium: 4
Sodium: 139
Total Bilirubin: 0.8
Total Protein: 7.8

## 2011-09-21 LAB — CBC
HCT: 37.8
Hemoglobin: 13.1
MCHC: 34.6
MCV: 102 — ABNORMAL HIGH
Platelets: 326
RBC: 3.71 — ABNORMAL LOW
RDW: 13.8
WBC: 4.9

## 2011-09-21 LAB — URINALYSIS, ROUTINE W REFLEX MICROSCOPIC
Bilirubin Urine: NEGATIVE
Glucose, UA: NEGATIVE
Hgb urine dipstick: NEGATIVE
Ketones, ur: NEGATIVE
Nitrite: NEGATIVE
Protein, ur: NEGATIVE
Specific Gravity, Urine: 1.016
Urobilinogen, UA: 0.2
pH: 5.5

## 2011-09-21 LAB — LIPASE, BLOOD: Lipase: 13

## 2012-03-04 ENCOUNTER — Other Ambulatory Visit: Payer: Self-pay | Admitting: Internal Medicine

## 2012-03-20 ENCOUNTER — Other Ambulatory Visit: Payer: Self-pay | Admitting: Obstetrics and Gynecology

## 2012-04-03 ENCOUNTER — Other Ambulatory Visit: Payer: Self-pay | Admitting: Obstetrics and Gynecology

## 2012-04-03 DIAGNOSIS — Z1231 Encounter for screening mammogram for malignant neoplasm of breast: Secondary | ICD-10-CM

## 2012-04-07 ENCOUNTER — Ambulatory Visit: Payer: Self-pay | Admitting: Internal Medicine

## 2012-04-07 VITALS — BP 148/78 | HR 86 | Temp 98.3°F | Resp 18 | Ht 65.0 in | Wt 131.0 lb

## 2012-04-07 DIAGNOSIS — G43909 Migraine, unspecified, not intractable, without status migrainosus: Secondary | ICD-10-CM | POA: Insufficient documentation

## 2012-04-07 DIAGNOSIS — F419 Anxiety disorder, unspecified: Secondary | ICD-10-CM | POA: Insufficient documentation

## 2012-04-07 DIAGNOSIS — M542 Cervicalgia: Secondary | ICD-10-CM

## 2012-04-07 DIAGNOSIS — R251 Tremor, unspecified: Secondary | ICD-10-CM | POA: Insufficient documentation

## 2012-04-07 DIAGNOSIS — G8929 Other chronic pain: Secondary | ICD-10-CM | POA: Insufficient documentation

## 2012-04-07 MED ORDER — HYDROCODONE-ACETAMINOPHEN 10-325 MG PO TABS: 1.0000 | ORAL_TABLET | Freq: Four times a day (QID) | ORAL | Status: DC | PRN

## 2012-04-07 MED ORDER — METOPROLOL TARTRATE 25 MG PO TABS: 25.0000 mg | ORAL_TABLET | Freq: Every day | ORAL | Status: DC

## 2012-04-07 MED ORDER — AMOXICILLIN 875 MG PO TABS: 875.0000 mg | ORAL_TABLET | Freq: Two times a day (BID) | ORAL | Status: AC

## 2012-04-07 MED ORDER — CYCLOBENZAPRINE HCL 10 MG PO TABS: 10.0000 mg | ORAL_TABLET | Freq: Three times a day (TID) | ORAL | Status: DC | PRN

## 2012-04-07 MED ORDER — CLONAZEPAM 1 MG PO TABS: 1.0000 mg | ORAL_TABLET | Freq: Three times a day (TID) | ORAL | Status: DC | PRN

## 2012-04-07 NOTE — Progress Notes (Signed)
  Subjective:    Patient ID: Carmen Rasmussen, female    DOB: [birth year], 61 y.o.   MRN: [id number]  HPIChronic neck pain care for by Dr. Ethelene Hal with injections and medications which she can no longer afford/Injections of $4000/office visits over $300 every 3 months/no insurance  She is a new grandmother and excited about this  Her generalized anxiety disorder is well controlled requiring an occasional Klonopin and she needs refills  She has a history of recurrent ear discomfort which seems to improve with amoxicillin  She has benign essential tremor controlled by small doses of metoprolol      Review of Systems     Objective:   Physical Exam Vital signs with mild increase in blood pressure The tympanic membranes are clear and the canals are clear She has poor dentition with an abscessed tooth on the left Her TMJs are not particularly tender       Assessment & Plan:  Chronic neck pain  Refill Flexeril and Vicodin Generalized anxiety disorder  Refill Klonopin Referred ear pain/? Dental abscess  Amoxicillin 875 Tremor-BET  Metoprolol tartrate 5 mg  Meds ordered this encounter  Medications  . DISCONTD: cyclobenzaprine (FLEXERIL) 10 MG tablet    Sig: Take 10 mg by mouth 3 (three) times daily as needed.  Marland Kitchen DISCONTD: HYDROcodone-acetaminophen (NORCO) 10-325 MG per tablet    Sig: Take 1-2 tablets by mouth every 6 (six) hours as needed for pain.    Dispense:  90 tablet    Refill:  5  . clonazePAM (KLONOPIN) 1 MG tablet    Sig: Take 1 tablet (1 mg total) by mouth 3 (three) times daily as needed for anxiety.    Dispense:  90 tablet    Refill:  5  . cyclobenzaprine (FLEXERIL) 10 MG tablet    Sig: Take 1 tablet (10 mg total) by mouth 3 (three) times daily as needed.    Dispense:  30 tablet    Refill:  5  . metoprolol tartrate (LOPRESSOR) 25 MG tablet    Sig: Take 1 tablet (25 mg total) by mouth daily.    Dispense:  90 tablet    Refill:  3    Needs office visit  .  amoxicillin (AMOXIL) 875 MG tablet    Sig: Take 1 tablet (875 mg total) by mouth 2 (two) times daily.    Dispense:  20 tablet    Refill:  0   Recheck 6 months

## 2012-04-10 ENCOUNTER — Telehealth: Payer: Self-pay | Admitting: *Deleted

## 2012-04-10 NOTE — Telephone Encounter (Signed)
Pt called and left message that she is confirming her appt for 4/19 @ 1015. Also she has records that she will bring from Dr. Hessie Diener Ross's office.

## 2012-04-12 ENCOUNTER — Ambulatory Visit (INDEPENDENT_AMBULATORY_CARE_PROVIDER_SITE_OTHER): Payer: Self-pay | Admitting: *Deleted

## 2012-04-12 ENCOUNTER — Other Ambulatory Visit: Payer: Self-pay | Admitting: Obstetrics and Gynecology

## 2012-04-12 ENCOUNTER — Ambulatory Visit (HOSPITAL_COMMUNITY): Admission: RE | Admit: 2012-04-12 | Payer: Self-pay | Source: Ambulatory Visit

## 2012-04-12 VITALS — BP 114/69 | HR 75 | Temp 99.3°F | Ht 65.0 in | Wt 132.4 lb

## 2012-04-12 DIAGNOSIS — N644 Mastodynia: Secondary | ICD-10-CM

## 2012-04-12 DIAGNOSIS — N63 Unspecified lump in unspecified breast: Secondary | ICD-10-CM

## 2012-04-12 DIAGNOSIS — Z1239 Encounter for other screening for malignant neoplasm of breast: Secondary | ICD-10-CM

## 2012-04-12 DIAGNOSIS — N6320 Unspecified lump in the left breast, unspecified quadrant: Secondary | ICD-10-CM

## 2012-04-12 NOTE — Progress Notes (Signed)
Complaints of lump in left breast.  Pap Smear:    Pap smear not performed today. Patients last Pap smear was 03/20/12 at [facility] and normal. Per patient she has a history of an abnormal Pap smear around 35 years ago that required a repeat Pap smear that was normal. Pap smear result above is in EPIC.  Physical exam: Breasts Breasts symmetrical. No skin abnormalities bilateral breasts. No nipple retraction left breast. Right nipple slightly inverted per patient normal. No nipple discharge bilateral breasts. No lymphadenopathy. No lumps palpated right breast. Palpated lump in the left breast at 6 o'clock at areolar region. Per patient tender on palpation. Per patient last mammogram was 10 years ago. Patient referred to the [facility] for Diagnostic Mammogram and left breast ultrasound. Appointment scheduled for Wednesday, April 17, 2012 at 1010.    Pelvic/Bimanual No Pap smear completed today since last Pap smear was 03/20/12 and normal. Pap smear not indicated per BCCCP guidelines.

## 2012-04-12 NOTE — Patient Instructions (Signed)
Taught patient how to perform BSE and gave educational materials to take home. Patient did not need a Pap smear today due to last Pap smear was 03/20/12. Let her know BCCCP will cover Pap smears every 3 years unless has a history of abnormal Pap smears. Patient referred to the [facility] for Diagnostic Mammogram and left breast ultrasound. Appointment scheduled for Wednesday, April 17, 2012 at 1010. Patient aware of appointment and will be there. Let patient know will follow up with her within the next couple weeks with results. Patient verbalized understanding.

## 2012-04-17 ENCOUNTER — Ambulatory Visit
Admission: RE | Admit: 2012-04-17 | Discharge: 2012-04-17 | Disposition: A | Discharge status: Home / Self Care | Payer: No Typology Code available for payment source | Source: Ambulatory Visit | Attending: Obstetrics and Gynecology | Admitting: Obstetrics and Gynecology

## 2012-04-17 ENCOUNTER — Other Ambulatory Visit: Payer: Self-pay | Admitting: Obstetrics and Gynecology

## 2012-04-17 DIAGNOSIS — N644 Mastodynia: Secondary | ICD-10-CM

## 2012-04-23 ENCOUNTER — Telehealth: Payer: Self-pay

## 2012-04-23 NOTE — Telephone Encounter (Signed)
Called pt and left message to return our call to Christine Brannock @ [phone].  

## 2012-04-24 ENCOUNTER — Telehealth: Payer: Self-pay | Admitting: *Deleted

## 2012-04-24 NOTE — Telephone Encounter (Signed)
Called patient and left message. Patient called me back. Patient did state she got her results to her diagnostic mammogram and is aware that it was negative. Let patient know will need to have a screening mammogram in 1 year. Patient verbalized understanding.

## 2012-07-17 ENCOUNTER — Telehealth: Payer: Self-pay

## 2012-07-17 ENCOUNTER — Other Ambulatory Visit: Payer: Self-pay | Admitting: Internal Medicine

## 2012-07-17 NOTE — Telephone Encounter (Signed)
PT STATES SHE DOESN'T HAVE INSURANCE NOR ANY MONEY TO COME FOR AN OFFICE VISIT. WOULD LIKE TO HAVE THE SAME THING CALLED IN FOR A BROKEN TOOTH AS BEFORE CANNOT AFFORD TO GO TO THE DENTIST EITHER. PLEASE CALL [phone]   [facility] [address]

## 2012-07-17 NOTE — Telephone Encounter (Signed)
This would be okay with me but there is no information about this in her electronic medical record/ Probably amoxicillin and Vicodin Pole or old chart to be sure and either make a decision or leave for me to review

## 2012-07-19 MED ORDER — AMOXICILLIN 500 MG PO CAPS: 1000.0000 mg | ORAL_CAPSULE | Freq: Two times a day (BID) | ORAL | Status: AC

## 2012-07-19 NOTE — Telephone Encounter (Signed)
Paper chart in your box for your review because the patient has not bee seen in >9yr.

## 2012-07-19 NOTE — Telephone Encounter (Signed)
Call in amoxicillin 500 mg 2 tablets twice a day for 10 days Unfortunately can't call in Any pain medication so she should use ibuprofen or Tylenol

## 2012-07-19 NOTE — Telephone Encounter (Signed)
LMOM THAT RX HAS BEEN SENT IN AND TO PICKUP TYLENOL

## 2012-09-03 ENCOUNTER — Encounter: Payer: Self-pay | Admitting: Cardiology

## 2013-01-15 ENCOUNTER — Telehealth: Payer: Self-pay | Admitting: *Deleted

## 2013-01-15 NOTE — Telephone Encounter (Signed)
[facility] requesting refill on norco 10/325.  Last fill 10/02/12

## 2013-01-15 NOTE — Telephone Encounter (Signed)
Please pull paper chart.  

## 2013-01-16 NOTE — Telephone Encounter (Signed)
Left message for pharmacy to advise.  

## 2013-01-16 NOTE — Telephone Encounter (Signed)
Needs office visit, last seen in April 2013

## 2013-01-16 NOTE — Telephone Encounter (Signed)
Chart pulled to PA pool at nurses station [phone]

## 2013-02-27 ENCOUNTER — Telehealth: Payer: Self-pay

## 2013-02-27 NOTE — Telephone Encounter (Signed)
Pt is wanting to talk with someone about getting a referral to an ent office she states she has been having severe ear pain for months  And now has insurance and be able to have the referral   [phone]

## 2013-02-27 NOTE — Telephone Encounter (Signed)
She is provided the number for [facility] to try to call, and she is advised also to come here if needed.

## 2013-02-27 NOTE — Telephone Encounter (Signed)
We can not refer for an issue we have not evaluated. However she may be able to get appt without referral. If she wants to come here this is fine also. Will ask her how she wants to proceed when she calls back.

## 2013-03-02 ENCOUNTER — Ambulatory Visit (INDEPENDENT_AMBULATORY_CARE_PROVIDER_SITE_OTHER): Payer: BC Managed Care – PPO | Admitting: Internal Medicine

## 2013-03-02 DIAGNOSIS — H9203 Otalgia, bilateral: Secondary | ICD-10-CM

## 2013-03-02 DIAGNOSIS — G8929 Other chronic pain: Secondary | ICD-10-CM

## 2013-03-02 DIAGNOSIS — M25519 Pain in unspecified shoulder: Secondary | ICD-10-CM

## 2013-03-02 DIAGNOSIS — M542 Cervicalgia: Secondary | ICD-10-CM

## 2013-03-02 DIAGNOSIS — F419 Anxiety disorder, unspecified: Secondary | ICD-10-CM

## 2013-03-02 DIAGNOSIS — M25512 Pain in left shoulder: Secondary | ICD-10-CM

## 2013-03-02 DIAGNOSIS — H9209 Otalgia, unspecified ear: Secondary | ICD-10-CM

## 2013-03-02 DIAGNOSIS — G250 Essential tremor: Secondary | ICD-10-CM

## 2013-03-02 DIAGNOSIS — G4700 Insomnia, unspecified: Secondary | ICD-10-CM

## 2013-03-02 DIAGNOSIS — F411 Generalized anxiety disorder: Secondary | ICD-10-CM

## 2013-03-02 MED ORDER — HYDROCODONE-ACETAMINOPHEN 10-325 MG PO TABS: 1.0000 | ORAL_TABLET | Freq: Two times a day (BID) | ORAL | Status: DC | PRN

## 2013-03-02 MED ORDER — CLONAZEPAM 1 MG PO TABS: 1.0000 mg | ORAL_TABLET | Freq: Every day | ORAL | Status: DC | PRN

## 2013-03-02 NOTE — Progress Notes (Signed)
  Subjective:    Patient ID: Carmen Rasmussen, female    DOB: 1951/04/18, 62 y.o.   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   Continues to complain of otalgia/pressure L > R--can't use ear plugs without increasing pain No hearing loss/no tinnitus/not dizzy//evaluation by me several months ago discovered no etiology Treatment for allergic conditions did not improve symptoms She is now requesting referral to ENT  She continues to have intermittent problems with tremor of upper extremities/occasionally interferes with writing but not frequently/has been stable for several years/past diagnosis was benign essential tremor/she has been off beta blockers for the last year and doesn't remember if they helped  She continues to have significant  Problems with neck pain and stiffness and pain in the left shoulder. She continues  to require hydrocodone twice a day to continue activities. She was last treated by Dr.Ramos with injections and hydrocodone about a year ago or more without significant relief. She had a series of 3 injections at least. Her shoulder pain was thought to be related to her neck pain. She now describes nocturnal pain in the shoulder if she sleeps on it and some trouble with range of motion of the shoulder. Her neck pain seems to be different. There is no weakness in the left extremity that she reports.  Her generalized anxiety disorder is much improved and she is using just occasional Klonopin. No depression. No further panic attacks.  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  Past medical history includes the diagnosis of carpal tunnel  syndrome/spondylosis at all 5 lumbar vertebrae/history of shingles/history of idiopathic edema that was massive and included ascites as well as peripheral edema but which resolved after several months without clear diagnosis or treatment  Review of Systems No abnormal weight changes No fatigue No fever chills or night sweats No chest pain or palpitations No digestive problems No genitourinary complaints    Objective:   Physical Exam Vital signs were not entered in the chart by the CNA HEENT is clear including auditory canals and tympanic membranes Neck has decreased flexion and extension secondary to pain She is tender in the left trapezius in the left paracervical muscles The left shoulder has good abduction to 90 without pain There is tenderness over the posterior aspect of the deltoid Heart regular without murmur Extremities show no edema Neurological intact/there is no tremor today Mood good/affect appropriate        Assessment & Plan:  Problem #1 persistent otalgia  We'll refer to ENT Problem #2 chronic neck pain/left shoulder pain  We'll refer to Dr. Yevette Edwards because her neck difficulties are well-established and she will  need treatment, but also for more definitive diagnosis of the shoulder pain if necessary  Refill of hydrocodone for now Problem #3 insomnia /anxiety-much improved  Continue when necessary Klonopin Problem #4 intermittent tremor of the upper extremities probably benign essential tremor  No treatment at this point  She will followup in 6 months for more complete exam and  other preventive services

## 2013-03-05 ENCOUNTER — Telehealth: Payer: Self-pay

## 2013-03-05 MED ORDER — METOPROLOL TARTRATE 25 MG PO TABS: 25.0000 mg | ORAL_TABLET | Freq: Every day | ORAL | Status: DC

## 2013-03-05 NOTE — Telephone Encounter (Signed)
Referral has been sent. She should call to see if they can get her in, no one in referrals currently.  The phone # there is [phone].

## 2013-03-05 NOTE — Telephone Encounter (Signed)
Meds ordered this encounter  Medications  . metoprolol tartrate (LOPRESSOR) 25 MG tablet    Sig: Take 1 tablet (25 mg total) by mouth daily.    Dispense:  90 tablet    Refill:  1   tremor

## 2013-03-05 NOTE — Telephone Encounter (Signed)
Spoke to patient and she is requesting Metoprolol 25 mg for her tremor. There is no record of this medication, she states you wanted her to go back on it. I pended it. She did state she has heard from ENT also about the appt.

## 2013-03-05 NOTE — Telephone Encounter (Signed)
Pt needs a script for beta blockers [facility] [address]   Also, wants to know when she will be referred to ENT [facility] ORTH[facility]  Cbn:  [phone]

## 2013-03-06 NOTE — Telephone Encounter (Signed)
Patient advised this was sent in for her.

## 2013-03-11 ENCOUNTER — Ambulatory Visit (INDEPENDENT_AMBULATORY_CARE_PROVIDER_SITE_OTHER): Payer: BC Managed Care – PPO | Admitting: Family Medicine

## 2013-03-11 ENCOUNTER — Ambulatory Visit: Payer: BC Managed Care – PPO

## 2013-03-11 VITALS — BP 113/68 | HR 71 | Temp 98.4°F | Resp 16 | Ht 65.0 in | Wt 134.0 lb

## 2013-03-11 DIAGNOSIS — M542 Cervicalgia: Secondary | ICD-10-CM

## 2013-03-11 DIAGNOSIS — M62838 Other muscle spasm: Secondary | ICD-10-CM

## 2013-03-11 MED ORDER — IBUPROFEN 800 MG PO TABS: 800.0000 mg | ORAL_TABLET | Freq: Three times a day (TID) | ORAL | Status: DC | PRN

## 2013-03-11 MED ORDER — CYCLOBENZAPRINE HCL 5 MG PO TABS: 5.0000 mg | ORAL_TABLET | Freq: Three times a day (TID) | ORAL | Status: DC | PRN
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
## 2013-03-26 ENCOUNTER — Telehealth: Payer: Self-pay | Admitting: Radiology

## 2013-03-26 ENCOUNTER — Telehealth: Payer: Self-pay

## 2013-03-26 ENCOUNTER — Ambulatory Visit: Payer: Self-pay | Admitting: Internal Medicine

## 2013-03-26 NOTE — Telephone Encounter (Signed)
Patient wants message to Benny Lennert, she is upset her hydrocodone has been denied, I told her she was seen on 03/11/13 and was told to follow up if not better,or if she is worse. She states she is much worse, does not want to come in, does not want to see [facility]. She wants refill of her hydrocodone. I could not even speak, she kept talking over me during our conversation, I read to her from the chart, plan was for her to follow up if not better. She states Maralyn Sago told her just to call for refill, however, note indicates she needs recheck. To you per patient, she wants me to send message to you for refill request. Amy

## 2013-03-26 NOTE — Telephone Encounter (Signed)
Called patient advised if she is still painful she should follow up. Also she is asked if she needs assistance with appt for [facility] she is to let me know.

## 2013-03-26 NOTE — Telephone Encounter (Signed)
Patient called again. She hasn't seen Dr. Ethelene Hal in years. When she tried to set up appt with [facility] she owes balance since 2009. She cant pay that now so cant see them. She said that you were supposed to make a note about running out of hydrocodone early since she would be using more frequently. Reviewed note and seen you documented she takes hydrocodone q 6 prn and would run out early but did not note if could fill early. But did see where she is to RTC if no improvement or worse. Instructed her she needs to RTC. Several times. She was is upset and crying and she doesn't have money and doesn't understand since she was told she could use q 6 and has ran out. Please advise

## 2013-03-26 NOTE — Telephone Encounter (Signed)
PATIENT IS CALLING ABOUT A HYDROCODONE RX. [facility] IS REFUSING TO REFILL THIS MEDICATION HOWEVER PATIENT STATES THAT THE LAST PROVIDER SHE SAW SAID THAT SHE SHOULD BE ABLE TO CONTINUE GETTING THIS REFILLED. PATIENT WAS VERY EMOTIONAL WHILE TALKING.   [phone]

## 2013-03-27 NOTE — Telephone Encounter (Signed)
Carmen Rasmussen is advised of this. Carmen Rasmussen states Carmen Rasmussen will go back to bid dosing today, Carmen Rasmussen plans to see Dr Merla Riches on Saturday since Carmen Rasmussen does not feel Carmen Rasmussen will be able to reduce this medication to bid. To you FYI

## 2013-03-27 NOTE — Telephone Encounter (Signed)
Called [facility] and she 11 days early. Spoke to Benny Lennert, she states patient can get this early,however she needs to cut back to bid dosing if she is not able to maintain this baseline dose, she is to return to [facility]. I have called [facility] and released the refill early and then called patient to advise. Left message for patient to call me back so I can advise.
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
## 2013-06-11 ENCOUNTER — Ambulatory Visit (INDEPENDENT_AMBULATORY_CARE_PROVIDER_SITE_OTHER): Payer: BC Managed Care – PPO | Admitting: Family Medicine

## 2013-06-11 VITALS — BP 110/73 | HR 96 | Temp 98.0°F | Resp 16 | Ht 66.5 in | Wt 134.0 lb

## 2013-06-11 DIAGNOSIS — J040 Acute laryngitis: Secondary | ICD-10-CM

## 2013-06-11 DIAGNOSIS — J039 Acute tonsillitis, unspecified: Secondary | ICD-10-CM

## 2013-06-11 DIAGNOSIS — J029 Acute pharyngitis, unspecified: Secondary | ICD-10-CM

## 2013-06-11 LAB — POCT RAPID STREP A (OFFICE): Rapid Strep A Screen: NEGATIVE

## 2013-06-11 MED ORDER — DIPHENHYD-HYDROCORT-NYSTATIN MT SUSP: OROMUCOSAL | Status: DC

## 2013-06-11 MED ORDER — AMOXICILLIN 500 MG PO CAPS: 500.0000 mg | ORAL_CAPSULE | Freq: Two times a day (BID) | ORAL | Status: DC

## 2013-06-11 NOTE — Progress Notes (Signed)
 [facility]:  Office Visit  Chief Complaint:  Chief Complaint  Patient presents with  . Sore Throat    lost voice x 2 day    HPI: Carmen Rasmussen is a 62 y.o. female who complains of  2 day history of hoarse voice and throat pain, she has had some ear pressure and some pain, no fevers, chills. 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 She has not tried anything for it. Still has tonsils. No cough. No wheezes or SOB.   Past Medical History  Diagnosis Date  . Carpal tunnel syndrome   . Edema   . History of shingles   . Tachycardia   . Hyperextension injury of cervical spine   . Hypertension   . Abnormal Pap smear    Past Surgical History  Procedure Laterality Date  . Cesarean section     History   Social History  . Marital Status: Divorced    Spouse Name: N/A    Number of Children: 1  . Years of Education: N/A   Occupational History  . bartender     part time   Social History Main Topics  . Smoking status: Never Smoker   . Smokeless tobacco: None  . Alcohol Use: No  . Drug Use: No  . Sexually Active: Yes    Birth Control/ Protection: None   Other Topics Concern  . None   Social History Narrative  . None   Family History  Problem Relation Age of Onset  . Asthma Mother   . Thyroid disease Mother   . Diabetes Mother   . Stroke Mother   . Diabetes Father   . Colon cancer Sister   . Hypertension Brother   . Thyroid disease Sister   . Asthma Sister   . Breast cancer Sister   . Colon cancer Sister   . Colon cancer Maternal Grandmother   . Colon cancer Paternal Aunt     throat  . Colon cancer Paternal Uncle     stomach  . Colon cancer Paternal Aunt     kidney   Allergies  Allergen Reactions  . Meloxicam Swelling and Rash   Prior to Admission medications   Medication Sig Start Date End Date Taking? Authorizing Provider  clonazePAM  (KLONOPIN) 1 MG tablet Take 1 tablet (1 mg total) by mouth daily as needed for anxiety. 03/02/13  Yes Tonye Pearson, MD  estradiol (ESTRACE) 1 MG tablet Take 1 mg by mouth daily.    Yes Historical Provider, MD  HYDROcodone-acetaminophen (NORCO) 10-325 MG per tablet Take 1 tablet by mouth 2 (two) times daily as needed. 03/02/13  Yes Tonye Pearson, MD  ibuprofen (ADVIL,MOTRIN) 800 MG tablet Take 1 tablet (800 mg total) by mouth every 8 (eight) hours as needed. 03/11/13  Yes Morrell Riddle, PA-C  medroxyPROGESTERone (PROVERA) 2.5 MG tablet Take 2.5 mg by mouth daily.     Yes Historical Provider, MD  metoprolol tartrate (LOPRESSOR) 25 MG tablet Take 1 tablet (25 mg total) by mouth daily. 03/05/13  Yes Tonye Pearson, MD  cyclobenzaprine (FLEXERIL) 5 MG tablet Take 1 tablet (5 mg total) by mouth 3 (three) times daily as needed for muscle spasms. 03/11/13   Morrell Riddle, PA-C     ROS: The patient denies fevers, chills, night sweats, unintentional weight Rasmussen, chest pain, palpitations,  wheezing, dyspnea on exertion, nausea, vomiting, abdominal pain, dysuria, hematuria, melena, numbness, weakness, or tingling.   All other systems have been reviewed and were otherwise negative with the exception of those mentioned in the HPI and as above.    PHYSICAL EXAM: Filed Vitals:   06/11/13 1559  BP: 110/73  Pulse: 96  Temp: 98 F (36.7 C)  Resp: 16   Filed Vitals:   06/11/13 1559  Height: 5' 6.5" (1.689 m)  Weight: 134 lb (60.782 kg)   Body mass index is 21.31 kg/(m^2).  General: Alert, no acute distress HEENT:  Normocephalic, atraumatic, oropharynx patent. + erythematous throat, no exudates. No sinus tenderness. TM normal Cardiovascular:  Regular rate and rhythm, no rubs murmurs or gallops.  No Carotid bruits, radial pulse intact. No pedal edema.  Respiratory: Clear to auscultation bilaterally.  No wheezes, rales, or rhonchi.  No cyanosis, no use of accessory musculature GI: No organomegaly,  abdomen is soft and non-tender, positive bowel sounds.  No masses. Skin: No rashes. Neurologic: Facial musculature symmetric. Psychiatric: Patient is appropriate throughout our interaction. Lymphatic: No cervical lymphadenopathy Musculoskeletal: Gait intact.   LABS: Results for orders placed in visit on 06/11/13  CULTURE, GROUP A STREP      Result Value Range   Organism ID, Bacteria Normal Upper Respiratory Flora     Organism ID, Bacteria No Beta Hemolytic Streptococci Isolated    POCT RAPID STREP A (OFFICE)      Result Value Range   Rapid Strep A Screen Negative  Negative     EKG/XRAY:   Primary read interpreted by Dr. Conley Rolls at [facility].   ASSESSMENT/PLAN: Encounter Diagnoses  Name Primary?  . Acute pharyngitis Yes  . Acute laryngitis   . Acute tonsillitis    Rx Amoxacillin 500 mg BID Rx Benadryl-Hydrocortisone without Nystatin suspension BID prn F/u prn Work note given     ,  PHUONG, DO 06/14/2013 7:49 AM

## 2013-06-13 LAB — CULTURE, GROUP A STREP: Organism ID, Bacteria: NORMAL

## 2013-06-16 ENCOUNTER — Telehealth: Payer: Self-pay | Admitting: Family Medicine

## 2013-06-16 NOTE — Telephone Encounter (Signed)
LM that strep cx was negative

## 2013-09-02 ENCOUNTER — Ambulatory Visit (INDEPENDENT_AMBULATORY_CARE_PROVIDER_SITE_OTHER): Payer: BC Managed Care – PPO | Admitting: Internal Medicine

## 2013-09-02 VITALS — BP 110/70 | HR 62 | Temp 98.6°F | Resp 16 | Ht 65.0 in | Wt 133.6 lb

## 2013-09-02 DIAGNOSIS — R251 Tremor, unspecified: Secondary | ICD-10-CM

## 2013-09-02 DIAGNOSIS — N951 Menopausal and female climacteric states: Secondary | ICD-10-CM

## 2013-09-02 DIAGNOSIS — F419 Anxiety disorder, unspecified: Secondary | ICD-10-CM

## 2013-09-02 DIAGNOSIS — R002 Palpitations: Secondary | ICD-10-CM

## 2013-09-02 DIAGNOSIS — G8929 Other chronic pain: Secondary | ICD-10-CM

## 2013-09-02 DIAGNOSIS — Z9181 History of falling: Secondary | ICD-10-CM

## 2013-09-02 DIAGNOSIS — R259 Unspecified abnormal involuntary movements: Secondary | ICD-10-CM

## 2013-09-02 DIAGNOSIS — Z780 Asymptomatic menopausal state: Secondary | ICD-10-CM

## 2013-09-02 DIAGNOSIS — R296 Repeated falls: Secondary | ICD-10-CM

## 2013-09-02 DIAGNOSIS — F411 Generalized anxiety disorder: Secondary | ICD-10-CM

## 2013-09-02 DIAGNOSIS — M542 Cervicalgia: Secondary | ICD-10-CM

## 2013-09-02 MED ORDER — IBUPROFEN 800 MG PO TABS: 800.0000 mg | ORAL_TABLET | Freq: Three times a day (TID) | ORAL | Status: DC | PRN

## 2013-09-02 MED ORDER — METOPROLOL TARTRATE 25 MG PO TABS: 25.0000 mg | ORAL_TABLET | Freq: Every day | ORAL | Status: DC

## 2013-09-02 MED ORDER — ESTROGENS, CONJUGATED 0.625 MG/GM VA CREA: TOPICAL_CREAM | Freq: Every day | VAGINAL | Status: DC

## 2013-09-02 MED ORDER — HYDROCODONE-ACETAMINOPHEN 10-325 MG PO TABS: 1.0000 | ORAL_TABLET | Freq: Two times a day (BID) | ORAL | Status: DC | PRN

## 2013-09-02 MED ORDER — CLONAZEPAM 1 MG PO TABS: 1.0000 mg | ORAL_TABLET | Freq: Three times a day (TID) | ORAL | Status: DC | PRN
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
## 2013-10-03 ENCOUNTER — Telehealth: Payer: Self-pay

## 2013-10-03 DIAGNOSIS — F419 Anxiety disorder, unspecified: Secondary | ICD-10-CM

## 2013-10-03 MED ORDER — CLONAZEPAM 1 MG PO TABS: ORAL_TABLET | ORAL | Status: DC

## 2013-10-03 NOTE — Telephone Encounter (Signed)
Meds ordered this encounter  Medications  . clonazePAM (KLONOPIN) 1 MG tablet    Sig: 1 am, 1 midday, 1-2 hs prn    Dispense:  120 tablet    Refill:  5

## 2013-10-03 NOTE — Telephone Encounter (Signed)
Faxed patient advised

## 2013-10-03 NOTE — Telephone Encounter (Signed)
Patient was calling for her increase in clonazePAM (KLONOPIN) 1 MG tablet  [phone]

## 2013-10-03 NOTE — Telephone Encounter (Signed)
She is using 1 mg tid, still c/o anxiety, wants higher dose. Please advise.

## 2013-10-24 ENCOUNTER — Encounter: Payer: Self-pay | Admitting: Neurology

## 2013-10-24 ENCOUNTER — Ambulatory Visit (INDEPENDENT_AMBULATORY_CARE_PROVIDER_SITE_OTHER): Payer: BC Managed Care – PPO | Admitting: Neurology

## 2013-10-24 VITALS — BP 90/64 | HR 68 | Temp 98.1°F | Ht 65.0 in | Wt 133.0 lb

## 2013-10-24 DIAGNOSIS — G250 Essential tremor: Secondary | ICD-10-CM

## 2013-10-24 DIAGNOSIS — W19XXXA Unspecified fall, initial encounter: Secondary | ICD-10-CM

## 2013-10-24 MED ORDER — PROPRANOLOL HCL 20 MG PO TABS: 20.0000 mg | ORAL_TABLET | Freq: Two times a day (BID) | ORAL | Status: DC

## 2013-10-24 NOTE — Patient Instructions (Addendum)
Possibilities for falls include pinching of the spinal cord in your neck or nerve problems in your feet.  You don't exhibit abnormalities on exam to suggest problem in the neck, so it may be peripheral nerves.  However, you do have history of cervical spine problems, so we will first get an MRI of the neck.  If that doesn't show anything, then I would recommend the EMG test to look for nerve problems.  For tremor, I will give you propranolol 20mg  twice daily.  Stop the metoprolol.   Your MRI is scheduled at [facility] on Wednesday, November 5th at 2:00 pm.  Please check in at the first floor radiology department 15 minutes prior to your scheduled appointment time. Please enter the hospital off of [address] at [address].     [phone].
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
## 2013-10-29 ENCOUNTER — Telehealth: Payer: Self-pay | Admitting: Neurology

## 2013-10-29 ENCOUNTER — Ambulatory Visit (HOSPITAL_COMMUNITY): Admission: RE | Admit: 2013-10-29 | Payer: BC Managed Care – PPO | Source: Ambulatory Visit

## 2013-10-29 ENCOUNTER — Other Ambulatory Visit: Payer: Self-pay | Admitting: Neurology

## 2013-10-29 DIAGNOSIS — W19XXXA Unspecified fall, initial encounter: Secondary | ICD-10-CM

## 2013-10-29 DIAGNOSIS — M542 Cervicalgia: Secondary | ICD-10-CM

## 2013-10-29 NOTE — Telephone Encounter (Signed)
MRI rescheduled at [facility] due to cost. Cx appointment at [facility] radiology.  New appointment is 11/07/13 at 6:15 pm to arrive at 5:45 pm.

## 2013-10-29 NOTE — Telephone Encounter (Signed)
Spoke with the patient. Will call and discuss cost with [facility]. She will let me know if they will be more affordable.

## 2013-10-29 NOTE — Telephone Encounter (Signed)
Please call ASAP, would like to speak with someone prior to today's MRI-MRI scheduled at 200pm. Has high deductible and wants to discuss testing options. CB# [phone] / Roanna Raider

## 2013-11-06 ENCOUNTER — Ambulatory Visit (INDEPENDENT_AMBULATORY_CARE_PROVIDER_SITE_OTHER): Payer: BC Managed Care – PPO | Admitting: Emergency Medicine

## 2013-11-06 VITALS — BP 114/68 | HR 76 | Temp 98.2°F | Resp 16 | Ht 65.0 in | Wt 133.8 lb

## 2013-11-06 DIAGNOSIS — S4381XA Sprain of other specified parts of right shoulder girdle, initial encounter: Secondary | ICD-10-CM

## 2013-11-06 DIAGNOSIS — S335XXA Sprain of ligaments of lumbar spine, initial encounter: Secondary | ICD-10-CM

## 2013-11-06 DIAGNOSIS — IMO0002 Reserved for concepts with insufficient information to code with codable children: Secondary | ICD-10-CM

## 2013-11-06 MED ORDER — CYCLOBENZAPRINE HCL 10 MG PO TABS: 10.0000 mg | ORAL_TABLET | Freq: Three times a day (TID) | ORAL | Status: DC | PRN

## 2013-11-06 MED ORDER — PREDNISONE 10 MG PO KIT: PACK | ORAL | Status: DC

## 2013-11-06 NOTE — Patient Instructions (Signed)

## 2013-11-06 NOTE — Progress Notes (Signed)
[facility] [address] [place] [address] [phone]  Date:  11/06/2013   Name:  Carmen Rasmussen   DOB:  Dec 20, 1951   MRN:  [id number]  PCP:  Tonye Pearson, MD    Chief Complaint: Back Pain   History of Present Illness:  Carmen Rasmussen is a 62 y.o. very pleasant female patient who presents with the following:  Says that she developed pain in her low back and right shoulder that is new.  She says the pain in her back radiates into the left leg to above her knee and is associated with some mild numbness.  She complains bitterly of a need for "something to heal her".  She has narcotic at home that she does not want to take as it will burn out her kidneys.  She is insistent that she requires steroid taper.  She also has an increase in her left shoulder and neck pain since the overuse on Saturday.  No neuro symptoms or weakness in the right or left arm.  Is not taking her vicodin for pain.  No improvement with over the counter medications or other home remedies. Denies other complaint or health concern today.   Patient Active Problem List   Diagnosis Date Noted  . Neck pain 04/07/2012  . Neck pain, chronic 04/07/2012  . Tremor 04/07/2012  . Anxiety 04/07/2012  . Palpitations 06/17/2011  . Carpal tunnel syndrome   . Idiopathic edema   . History of shingles     Past Medical History  Diagnosis Date  . Carpal tunnel syndrome   . Edema   . History of shingles   . Tachycardia   . Hyperextension injury of cervical spine   . Hypertension   . Abnormal Pap smear     Past Surgical History  Procedure Laterality Date  . Cesarean section      History  Substance Use Topics  . Smoking status: Never Smoker   . Smokeless tobacco: Never Used  . Alcohol Use: No     Comment: "now and then"    Family History  Problem Relation Age of Onset  . Asthma Mother   . Thyroid disease Mother   . Diabetes Mother   . Stroke Mother   . Diabetes Father   . Colon  cancer Sister   . Hypertension Brother   . Thyroid disease Sister   . Asthma Sister   . Breast cancer Sister   . Colon cancer Sister   . Colon cancer Maternal Grandmother   . Colon cancer Paternal Aunt     throat  . Colon cancer Paternal Uncle     stomach  . Colon cancer Paternal Aunt     kidney    Allergies  Allergen Reactions  . Meloxicam Swelling and Rash    Medication list has been reviewed and updated.  Current Outpatient Prescriptions on File Prior to Visit  Medication Sig Dispense Refill  . clonazePAM (KLONOPIN) 1 MG tablet 1 am, 1 midday, 1-2 hs prn  120 tablet  5  . estradiol (ESTRACE) 1 MG tablet Take 1 mg by mouth daily.       Marland Kitchen HYDROcodone-acetaminophen (NORCO) 10-325 MG per tablet Take 1 tablet by mouth 2 (two) times daily as needed.  60 tablet  5  . ibuprofen (ADVIL,MOTRIN) 800 MG tablet Take 1 tablet (800 mg total) by mouth every 8 (eight) hours as needed.  40 tablet  0  . propranolol (INDERAL) 20 MG  tablet Take 1 tablet (20 mg total) by mouth 2 (two) times daily.  60 tablet  6  . conjugated estrogens (PREMARIN) vaginal cream Place vaginally daily.  42.5 g  12   No current facility-administered medications on file prior to visit.    Review of Systems:  As per HPI, otherwise negative.    Physical Examination: Filed Vitals:   11/06/13 1756  BP: 114/68  Pulse: 76  Temp: 98.2 F (36.8 C)  Resp: 16   Filed Vitals:   11/06/13 1756  Height: 5\' 5"  (1.651 m)  Weight: 133 lb 12.8 oz (60.691 kg)   Body mass index is 22.27 kg/(m^2). Ideal Body Weight: Weight in (lb) to have BMI = 25: 149.9   GEN: WDWN, NAD, Non-toxic, Alert & Oriented x 3 HEENT: Atraumatic, Normocephalic.  Ears and Nose: No external deformity. EXTR: No clubbing/cyanosis/edema NEURO: Normal gait.  PSYCH: Normally interactive. Conversant. Not depressed or anxious appearing.  Calm demeanor.  BACK:  Tender right upper back at scapular border.  Right lumbar paraspinous muscle tenderness  and guarding.  Neuro intact grossly   Assessment and Plan: Lumbar strain Scapulocostal strain Continue vicodin as directed by Dr Merla Riches Local heat  Flexeril Follow up with Dr Merla Riches sterapred DS  Signed,  Phillips Odor, MD

## 2013-11-07 ENCOUNTER — Other Ambulatory Visit: Payer: BC Managed Care – PPO

## 2013-11-12 ENCOUNTER — Telehealth: Payer: Self-pay | Admitting: Neurology
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
## 2013-11-15 ENCOUNTER — Other Ambulatory Visit: Payer: BC Managed Care – PPO

## 2013-11-17 ENCOUNTER — Ambulatory Visit (INDEPENDENT_AMBULATORY_CARE_PROVIDER_SITE_OTHER): Payer: BC Managed Care – PPO | Admitting: Internal Medicine

## 2013-11-17 ENCOUNTER — Telehealth: Payer: Self-pay

## 2013-11-17 VITALS — BP 110/60 | HR 66 | Temp 98.5°F | Resp 18 | Ht 65.0 in | Wt 131.6 lb

## 2013-11-17 DIAGNOSIS — R251 Tremor, unspecified: Secondary | ICD-10-CM

## 2013-11-17 DIAGNOSIS — F411 Generalized anxiety disorder: Secondary | ICD-10-CM

## 2013-11-17 DIAGNOSIS — M549 Dorsalgia, unspecified: Secondary | ICD-10-CM

## 2013-11-17 DIAGNOSIS — M542 Cervicalgia: Secondary | ICD-10-CM

## 2013-11-17 DIAGNOSIS — Z0000 Encounter for general adult medical examination without abnormal findings: Secondary | ICD-10-CM

## 2013-11-17 DIAGNOSIS — R259 Unspecified abnormal involuntary movements: Secondary | ICD-10-CM

## 2013-11-17 DIAGNOSIS — L659 Nonscarring hair loss, unspecified: Secondary | ICD-10-CM

## 2013-11-17 DIAGNOSIS — G8929 Other chronic pain: Secondary | ICD-10-CM

## 2013-11-17 LAB — POCT CBC
Granulocyte percent: 67.2 %G (ref 37–80)
HCT, POC: 41.5 % (ref 37.7–47.9)
Hemoglobin: 12.9 g/dL (ref 12.2–16.2)
Lymph, poc: 3.1 (ref 0.6–3.4)
MCH, POC: 32 pg — AB (ref 27–31.2)
MCHC: 31.1 g/dL — AB (ref 31.8–35.4)
MCV: 103 fL — AB (ref 80–97)
MID (cbc): 0.7 (ref 0–0.9)
MPV: 8.4 fL (ref 0–99.8)
POC Granulocyte: 7.7 — AB (ref 2–6.9)
POC LYMPH PERCENT: 26.8 %L (ref 10–50)
POC MID %: 6 %M (ref 0–12)
Platelet Count, POC: 309 10*3/uL (ref 142–424)
RBC: 4.03 M/uL — AB (ref 4.04–5.48)
RDW, POC: 12.8 %
WBC: 11.5 10*3/uL — AB (ref 4.6–10.2)

## 2013-11-17 MED ORDER — HYDROCODONE-ACETAMINOPHEN 10-325 MG PO TABS: 1.0000 | ORAL_TABLET | Freq: Two times a day (BID) | ORAL | Status: DC | PRN

## 2013-11-17 MED ORDER — CITALOPRAM HYDROBROMIDE 20 MG PO TABS: 20.0000 mg | ORAL_TABLET | Freq: Every day | ORAL | Status: DC

## 2013-11-17 NOTE — Telephone Encounter (Signed)
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

## 2013-11-17 NOTE — Telephone Encounter (Signed)
Thanks. These have been put in, but she should fast for the labs, left message to Advise.
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
## 2013-11-17 NOTE — Telephone Encounter (Signed)
Cbc,cmet,lipids,tsh

## 2013-11-17 NOTE — Telephone Encounter (Signed)
Can you order this?

## 2013-11-18 LAB — T4, FREE: Free T4: 1.27 ng/dL (ref 0.80–1.80)

## 2013-11-18 LAB — TSH: TSH: 0.644 u[IU]/mL (ref 0.350–4.500)

## 2013-11-18 LAB — COMPREHENSIVE METABOLIC PANEL
ALT: 25 U/L (ref 0–35)
AST: 18 U/L (ref 0–37)
Albumin: 4.4 g/dL (ref 3.5–5.2)
Alkaline Phosphatase: 44 U/L (ref 39–117)
BUN: 22 mg/dL (ref 6–23)
CO2: 29 mEq/L (ref 19–32)
Calcium: 9.5 mg/dL (ref 8.4–10.5)
Chloride: 96 mEq/L (ref 96–112)
Creat: 0.97 mg/dL (ref 0.50–1.10)
Glucose, Bld: 82 mg/dL (ref 70–99)
Potassium: 4.2 mEq/L (ref 3.5–5.3)
Sodium: 136 mEq/L (ref 135–145)
Total Bilirubin: 0.6 mg/dL (ref 0.3–1.2)
Total Protein: 7.3 g/dL (ref 6.0–8.3)

## 2013-11-18 LAB — LIPID PANEL
Cholesterol: 245 mg/dL — ABNORMAL HIGH (ref 0–200)
HDL: 79 mg/dL (ref >39)
LDL Cholesterol: 89 mg/dL (ref 0–99)
Total CHOL/HDL Ratio: 3.1 Ratio
Triglycerides: 386 mg/dL — ABNORMAL HIGH (ref <150)
VLDL: 77 mg/dL — ABNORMAL HIGH (ref 0–40)

## 2013-11-19 ENCOUNTER — Telehealth: Payer: Self-pay

## 2013-11-19 NOTE — Telephone Encounter (Signed)
All the labs you ordered were done, what else does she need?

## 2013-11-19 NOTE — Telephone Encounter (Signed)
PT STATES SHE IS COMING IN FOR BLOOD WORK PER DR DOOLITTLE AND WANTED TO MAKE SURE THE ORDER WAS IN FOR IT. HAD SOME BLOOD WORK, BUT NEEDED MORE PLEASE CALL [phone] IF NEEDED OTHERWISE WILL BE IN IN THE MORNING

## 2013-11-19 NOTE — Telephone Encounter (Signed)
Got all we needed

## 2013-11-20 ENCOUNTER — Ambulatory Visit (INDEPENDENT_AMBULATORY_CARE_PROVIDER_SITE_OTHER): Payer: BC Managed Care – PPO | Admitting: Internal Medicine

## 2013-11-20 VITALS — BP 110/64 | HR 77 | Temp 98.7°F | Resp 16 | Ht 65.0 in | Wt 131.6 lb

## 2013-11-20 DIAGNOSIS — F419 Anxiety disorder, unspecified: Secondary | ICD-10-CM

## 2013-11-20 DIAGNOSIS — M542 Cervicalgia: Secondary | ICD-10-CM

## 2013-11-20 DIAGNOSIS — G8929 Other chronic pain: Secondary | ICD-10-CM

## 2013-11-20 DIAGNOSIS — R251 Tremor, unspecified: Secondary | ICD-10-CM

## 2013-11-20 DIAGNOSIS — M25519 Pain in unspecified shoulder: Secondary | ICD-10-CM

## 2013-11-20 DIAGNOSIS — Z23 Encounter for immunization: Secondary | ICD-10-CM
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
## 2013-11-21 ENCOUNTER — Other Ambulatory Visit: Payer: Self-pay | Admitting: Emergency Medicine

## 2013-11-21 NOTE — Telephone Encounter (Signed)
Patient advised.

## 2013-12-05 ENCOUNTER — Telehealth: Payer: Self-pay

## 2013-12-05 MED ORDER — CITALOPRAM HYDROBROMIDE 20 MG PO TABS: 20.0000 mg | ORAL_TABLET | Freq: Every day | ORAL | Status: DC
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
## 2013-12-05 NOTE — Telephone Encounter (Signed)
Sent Rx to the pharmacy 

## 2013-12-05 NOTE — Telephone Encounter (Signed)
Pended Citalopram, please advise.

## 2013-12-31 ENCOUNTER — Ambulatory Visit (INDEPENDENT_AMBULATORY_CARE_PROVIDER_SITE_OTHER): Payer: Self-pay | Admitting: Family Medicine

## 2013-12-31 ENCOUNTER — Ambulatory Visit: Payer: BC Managed Care – PPO

## 2013-12-31 VITALS — BP 110/70 | HR 54 | Temp 98.7°F | Resp 16 | Ht 65.0 in | Wt 135.0 lb

## 2013-12-31 DIAGNOSIS — M549 Dorsalgia, unspecified: Secondary | ICD-10-CM

## 2013-12-31 DIAGNOSIS — M5489 Other dorsalgia: Secondary | ICD-10-CM

## 2013-12-31 DIAGNOSIS — IMO0002 Reserved for concepts with insufficient information to code with codable children: Secondary | ICD-10-CM

## 2013-12-31 DIAGNOSIS — S39012S Strain of muscle, fascia and tendon of lower back, sequela: Secondary | ICD-10-CM

## 2013-12-31 DIAGNOSIS — J029 Acute pharyngitis, unspecified: Secondary | ICD-10-CM

## 2013-12-31 LAB — POCT RAPID STREP A (OFFICE): Rapid Strep A Screen: NEGATIVE

## 2013-12-31 MED ORDER — TIZANIDINE HCL 4 MG PO CAPS: ORAL_CAPSULE | ORAL | Status: DC

## 2013-12-31 MED ORDER — PREDNISONE 20 MG PO TABS: ORAL_TABLET | ORAL | Status: DC

## 2013-12-31 NOTE — Progress Notes (Signed)
Subjective:    Patient ID: Carmen Rasmussen, female    DOB: Dec 26, 1950, 63 y.o.   MRN: [id number]  HPI Patient ID: Carmen Rasmussen MRN: [id number], DOB: Dec 26, 1950, 63 y.o. Date of Encounter: 12/31/2013, 5:25 PM  Primary Physician: Tonye PearsonOLITTLE, ROBERT P, MD  Chief Complaint:  Chief Complaint  Patient presents with  . Sore Throat    x 2 days    HPI: 63 y.o. female presents with a 2 day history of sore throat. Afebrile. No chills. Some rhinorrhea, otalgia, muffled hearing, and hoarseness in the mornings only. No cough, congestion, or sinus pressure. No GI complaints. Able to swallow saliva, but hurts to do so. She has not yet tried any OTC medications. Works at a call center. Multiple sick contacts.    She also mentions left sided back pain since mid November. She was doing some cleaning of her kitchen and in the process was bending over quite a bit. She was originally evaluated on November 13,14 and diagnosed with a lumbar sprain. She was placed on a prednisone taper and Flexeril. These did help her, but she continues to have off and on discomfort prompting her to bring this up again this evening. She does not have any loss of bowel or bladder function. No weakness.   Past Medical History  Diagnosis Date  . Carpal tunnel syndrome   . Edema   . History of shingles   . Tachycardia   . Hyperextension injury of cervical spine   . Hypertension   . Abnormal Pap smear      Home Meds: Prior to Admission medications   Medication Sig Start Date End Date Taking? Authorizing Provider  citalopram (CELEXA) 20 MG tablet Take 1 tablet (20 mg total) by mouth daily. 12/05/13  Yes Morrell RiddleSarah L Weber, PA-C  clonazePAM (KLONOPIN) 1 MG tablet 1 am, 1 midday, 1-2 hs prn 10/03/13  Yes Tonye Pearsonobert P Doolittle, MD  cyclobenzaprine (FLEXERIL) 10 MG tablet Take 1 tablet (10 mg total) by mouth 3 (three) times daily as needed for muscle spasms. 11/06/13  Yes Phillips OdorJeffery Anderson, MD  estradiol (ESTRACE) 1 MG tablet Take 1 mg by  mouth daily.    Yes Historical Provider, MD  HYDROcodone-acetaminophen (NORCO) 10-325 MG per tablet Take 1 tablet by mouth 2 (two) times daily as needed. 11/17/13  Yes Tonye Pearsonobert P Doolittle, MD  ibuprofen (ADVIL,MOTRIN) 800 MG tablet Take 1 tablet (800 mg total) by mouth every 8 (eight) hours as needed. 09/02/13  Yes Tonye Pearsonobert P Doolittle, MD         propranolol (INDERAL) 20 MG tablet Take 1 tablet (20 mg total) by mouth 2 (two) times daily. 10/24/13  Yes Adam Gus Rankinobert Jaffe, DO    Allergies:  Allergies  Allergen Reactions  . Meloxicam Swelling and Rash    History   Social History  . Marital Status: Divorced    Spouse Name: N/A    Number of Children: 1  . Years of Education: N/A   Occupational History  . bartender     part time   Social History Main Topics  . Smoking status: Never Smoker   . Smokeless tobacco: Never Used  . Alcohol Use: No     Comment: "now and then"  . Drug Use: No  . Sexual Activity: Yes    Birth Control/ Protection: None   Other Topics Concern  . Not on file   Social History Narrative  . No narrative on file      Review of Systems  Constitutional: Positive for fatigue. Negative for fever and chills.  HENT: Positive for ear pain, hearing loss, rhinorrhea and sore throat. Negative for congestion, postnasal drip, sinus pressure and tinnitus.   Respiratory: Negative for cough, shortness of breath and wheezing.   Gastrointestinal: Positive for nausea and diarrhea. Negative for vomiting.  Musculoskeletal: Positive for back pain, myalgias and neck pain. Negative for arthralgias, gait problem, joint swelling and neck stiffness.  Neurological: Positive for headaches.       Objective:   Physical Exam  Physical Exam: Blood pressure 110/70, pulse 54, temperature 98.7 F (37.1 C), temperature source Oral, resp. rate 16, height 5\' 5"  (1.651 m), weight 135 lb (61.236 kg), SpO2 99.00%., Body mass index is 22.47 kg/(m^2). General: Well developed, well nourished, in  no acute distress. Head: Normocephalic, atraumatic, eyes without discharge, sclera non-icteric, nares are patent. Bilateral auditory canals clear, TM's are without perforation, pearly grey with reflective cone of light bilaterally. No sinus TTP. Oral cavity moist, dentition normal. Posterior pharynx with post nasal drip and mild erythema. No peritonsillar abscess or tonsillar exudate. Neck: Supple. No thyromegaly. Full ROM. No lymphadenopathy. Lungs: Clear bilaterally to auscultation without wheezes, rales, or rhonchi. Breathing is unlabored. Heart: RRR with S1 S2. No murmurs, rubs, or gallops appreciated. Back: FROM. No midline TTP. Mild left sided paraspinal muscle TTP along the lumbar region, otherwise no paraspinal muscle TTP. Negative SLR both seated and supine.   Msk:  Strength and tone normal for age. 5/5 strength lower extremities.  Extremities: No clubbing or cyanosis. No edema. Neuro: Alert and oriented X 3. Moves all extremities spontaneously. CNII-XII grossly in tact. DTR 2+ throughout bilateral lower extremities.  Psych:  Responds to questions appropriately with a normal affect.    Labs: Results for orders placed in visit on 12/31/13  POCT RAPID STREP A (OFFICE)      Result Value Range   Rapid Strep A Screen Negative  Negative   Throat culture pending  Lumbar:  [facility] reading (PRIMARY) by  Dr. Milus Glazier.  Mild spondylosis otherwise negative.       Assessment & Plan:  63 year old female with viral pharyngitis and lumbar strain   1) Viral pharyngitis  -Supportive care -Prednisone below may help -Await culture results  2) Lumbar strain -Repeat Prednisone 20 mg #18 3x3, 2x3, 1x3 no RF -Zanaflex 4 mg 1 po qhs #30 no RF -Consider PT -Rest   Eula Listen, PA-C [facility] [address] [place], [place] [address] [phone] 12/31/2013 5:26 PM

## 2014-01-01 ENCOUNTER — Telehealth: Payer: Self-pay | Admitting: Radiology

## 2014-01-01 ENCOUNTER — Telehealth: Payer: Self-pay

## 2014-01-01 NOTE — Telephone Encounter (Signed)
Pt can ask for a smaller quantity based on her funds.  The other option if the Flexeril that she has had in the past works for her we could change it to that if she would like. Please write her a note for off of work.

## 2014-01-01 NOTE — Telephone Encounter (Signed)
She will get a smaller quantity of the medication.

## 2014-01-01 NOTE — Telephone Encounter (Signed)
Work note provided for today per patient request.

## 2014-01-01 NOTE — Telephone Encounter (Signed)
Please advise do you want to change the muscle relaxer for her?

## 2014-01-01 NOTE — Telephone Encounter (Signed)
PATIENT STATES SHE WAS IN THE OFFICE WED. TO SEE RYAN DUNN FOR HER BACK AND SOME OTHER THINGS. HE PRESCRIBED HER PREDNISONE AND A MUSCLE RELAXER SHE WAS ABLE TO GET THE PREDNISONE BECAUSE IT WAS ONLY $4.00. HOWEVER, THE MUSCLE RELAXER WAS $60.00 AND SHE COULD NOT AFFORD THAT. SHE WOULD LIKE TO KNOW IF RYAN CAN GIVE HER A LOWER QUANITY AMOUNT THAT WILL NOT BE SO EXPENSIVE? SHE ALSO NEEDS A NOTE FOR WORK STATING THAT SHE WAS SEEN ON WED. AND WILL BE OUT TODAY (THURS) AND RETURN ON Friday. PLEASE CALL HER WHEN IT CAN BE PICKED UP. SHE WILL NEED IT BEFORE TOMORROW. BEST PHONE [phone] (CELL)  PHARMACY CHOICE IS [facility] ON [address].  MBC

## 2014-01-02 ENCOUNTER — Telehealth: Payer: Self-pay

## 2014-01-02 LAB — CULTURE, GROUP A STREP: Organism ID, Bacteria: NORMAL

## 2014-01-02 NOTE — Telephone Encounter (Signed)
Normal Upper Respiratory Flora   Organism ID, Bacteria  No Beta Hemolytic Streptococci Isolated    She does not have strep, advised her this may be viral. Advised her good handwashing is essential and to make sure she uses ibuprofen and make sure her granddaughter does not drink / eat after her.

## 2014-01-02 NOTE — Telephone Encounter (Signed)
Patient is calling about her culture results. She has to babysit her 61 year old granddaughter and wants to make sure that she is not contagious and she would also like to know if she should be taking antibiotics since she was not prescribed anything.  Best # [phone]

## 2014-03-24 ENCOUNTER — Ambulatory Visit (INDEPENDENT_AMBULATORY_CARE_PROVIDER_SITE_OTHER): Payer: BC Managed Care – PPO | Admitting: Internal Medicine

## 2014-03-24 VITALS — BP 106/73 | HR 75 | Temp 98.7°F | Resp 18 | Wt 137.0 lb

## 2014-03-24 DIAGNOSIS — R251 Tremor, unspecified: Secondary | ICD-10-CM

## 2014-03-24 DIAGNOSIS — F419 Anxiety disorder, unspecified: Secondary | ICD-10-CM

## 2014-03-24 DIAGNOSIS — J029 Acute pharyngitis, unspecified: Secondary | ICD-10-CM

## 2014-03-24 DIAGNOSIS — M5430 Sciatica, unspecified side: Secondary | ICD-10-CM

## 2014-03-24 DIAGNOSIS — M542 Cervicalgia: Secondary | ICD-10-CM

## 2014-03-24 DIAGNOSIS — M5432 Sciatica, left side: Secondary | ICD-10-CM | POA: Insufficient documentation

## 2014-03-24 DIAGNOSIS — G8929 Other chronic pain: Secondary | ICD-10-CM

## 2014-03-24 DIAGNOSIS — F411 Generalized anxiety disorder: Secondary | ICD-10-CM

## 2014-03-24 DIAGNOSIS — R259 Unspecified abnormal involuntary movements: Secondary | ICD-10-CM

## 2014-03-24 MED ORDER — HYDROCODONE-ACETAMINOPHEN 10-325 MG PO TABS: 1.0000 | ORAL_TABLET | Freq: Two times a day (BID) | ORAL | Status: DC | PRN

## 2014-03-24 MED ORDER — HYDROCODONE-ACETAMINOPHEN 10-325 MG PO TABS: 1.0000 | ORAL_TABLET | Freq: Two times a day (BID) | ORAL | Status: DC

## 2014-03-24 MED ORDER — PREDNISONE 20 MG PO TABS: ORAL_TABLET | ORAL | Status: DC

## 2014-03-24 MED ORDER — CLONAZEPAM 1 MG PO TABS: ORAL_TABLET | ORAL | Status: DC

## 2014-03-24 MED ORDER — METOPROLOL SUCCINATE ER 25 MG PO TB24: 25.0000 mg | ORAL_TABLET | Freq: Every day | ORAL | Status: DC

## 2014-03-24 NOTE — Progress Notes (Signed)
Subjective:    Patient ID: Carmen Rasmussen, female    DOB: Feb 14, 1951, 63 y.o.   MRN: [id number] This chart was scribed for Carmen Sia, MD by Nicholos Johns, Medical Scribe. This patient's care was started at 5:25 PM.  HPI HPI Comments: [name] is a 63 y.o. female who presents to the [facility] for hydrocodone, Metoprolol, and Klonopin refill. No longer takes Celexa; states it made her feel "crazy." Trying to refrain from taking Klonopin unless needed to go to sleep at night. Reports she was laid off from previous job in January and has since landed a new job she is very excited about provided she passes a drug test she took today. Anxiety is still very reactive. No depression.  Also reports constant left hip pain that has been ongoing for months. States pain is worse when reclined as well as getting in and out of the car. Getting out of the bed makes pain severe. Sleeps with left leg flexed at the left hip and knee to minimize pain. X-rays were done several months ago and considered normal. She improved with a prednisone taper. No weakness in the extremity. Not at a point that she was orthopedic consultation.  Also reports left shoulder pain that radiates into lower arm. She is status post chronic neck problems Injections per Dr. Ethelene Hal  Continue using metoprolol for benign essential tremor. Neurology suggested propranolol but it made her too sleepy. She has mild control only.  Reports at a recent doctor visit, estrogen level was noted at 62 which is moderately low for her age. States this results in a constant burning with no fever. Was placed on a patch but no changes.  Lastly reports concern for recent weight gain. Would like a method that will assist in weight loss.  Review of Systems  Constitutional: Negative for fever, fatigue and unexpected weight change.  Respiratory: Negative for chest tightness and shortness of breath.   Cardiovascular: Negative  for chest pain, palpitations and leg swelling.  Gastrointestinal: Negative for abdominal pain, diarrhea and constipation.  Genitourinary: Negative for difficulty urinating.  Musculoskeletal: Positive for arthralgias.  Skin: Negative for rash.  Neurological: Negative for weakness and headaches.  Psychiatric/Behavioral: Negative for behavioral problems.   Objective:  Physical Exam  Vitals reviewed. Constitutional: She is oriented to person, place, and time. She appears well-developed and well-nourished. No distress.  HENT:  Head: Normocephalic and atraumatic.  Eyes: EOM are normal.  Neck: Neck supple.  Cardiovascular: Normal rate.   Pulmonary/Chest: Effort normal. No respiratory distress.  Musculoskeletal: Normal range of motion.  Tenderness over SI joint on the left with pain there with external rotation and full straight leg raise.  Lymphadenopathy:    She has no cervical adenopathy.  Neurological: She is alert and oriented to person, place, and time.  Mild tremor in hands  Skin: Skin is warm and dry.  Psychiatric: Her speech is normal and behavior is normal. Judgment and thought content normal. Her mood appears anxious. Her affect is not inappropriate. She is not agitated and not combative. Cognition and memory are normal. She is attentive.   Reviewed x-rays and I see no problems with the spine or the left SI joint Assessment & Plan:  Neck pain, chronic - Plan: HYDROcodone-acetaminophen (NORCO) 10-325 MG per tablet///also for sciatica  Anxiety - Plan: clonazePAM (KLONOPIN) 1 MG tablet  Sciatica of left side- predniSONE (DELTASONE) 20 MG tablet taper//the next step when she can afford it as an  orthopedic  consult  Tremor-continue metoprolol  Meds ordered this encounter  Medications  . metoprolol succinate (TOPROL-XL) 25 MG 24 hr tablet    Sig: Take 1-2 tablets (25-50 mg total) by mouth daily.    Dispense:  60 tablet    Refill:  11  . HYDROcodone-acetaminophen (NORCO) 10-325  MG per tablet  3  Sig: Take 1 tablet by mouth 2 (two) times daily as needed.    Dispense:  60 tablet    Refill:  0  . clonazePAM (KLONOPIN) 1 MG tablet    Sig: 1 am, 1 midday, 1-2 hs prn    Dispense:  120 tablet    Refill:  5  . HYDROcodone-acetaminophen (NORCO) 10-325 MG per tablet  1  Sig: Take 1 tablet by mouth 2 (two) times daily. For 30d from signing    Dispense:  60 tablet    Refill:  0  . HYDROcodone-acetaminophen (NORCO) 10-325 MG per tablet  2  Sig: Take 1 tablet by mouth 2 (two) times daily. For 60 d from signing    Dispense:  60 tablet    Refill:  0  . predniSONE (DELTASONE) 20 MG tablet    Sig: 3 PO FOR 3 DAYS, 2 PO FOR 3 DAYS, 1 PO FOR 3 DAYS    Dispense:  18 tablet    Refill:  0    Order Specific Question:      Answer:     Followup 3 months  I have completed the patient encounter in its entirety as documented by the scribe, with editing by me where necessary. Uzma Hellmer P. Merla Richesoolittle, M.D.

## 2014-06-01 ENCOUNTER — Other Ambulatory Visit: Payer: Self-pay | Admitting: Internal Medicine

## 2014-06-01 NOTE — Telephone Encounter (Signed)
According to last OV notes, pt is no longer taking citalopram although it was still left on her med list. LMOM for pt to CB to advise whether she is taking this, get details. Pt is to f/up at end of June.

## 2014-06-02 NOTE — Telephone Encounter (Signed)
Called pt back and she advised that when she saw Dr Merla Riches 3/31, he had told her that she was prob not sleeping well because she was not taking her citalopram any longer. She started taking it 3 days later, and would like a RF to continue. I sent this in and advised pt that she is due for f/up end of June. Pt agreed.  Pt reported that when Dr Merla Riches sent in metoprolol for her at OV, it was sent for succinate instead of tartrate that she has always taken and it is more expensive. She would like a RF of the tartrate that she has been on previously. She has an expired bottle that was for 50 mg, but the most recent I see was for the 25 mg. I have pended the 25 mg written as Dr Merla Riches had written for succinate, 1-2 tabs QD. Dr Merla Riches, please review.

## 2014-06-03 MED ORDER — METOPROLOL TARTRATE 25 MG PO TABS: ORAL_TABLET | ORAL | Status: DC

## 2014-06-26 ENCOUNTER — Telehealth: Payer: Self-pay

## 2014-06-26 NOTE — Telephone Encounter (Signed)
Spoke with [facility]. Per Dr. Merla Richesoolittle, ok to call in script from March. Rx called in.

## 2014-06-26 NOTE — Telephone Encounter (Signed)
Patient states Dr. Merla Richesoolittle wrote 3 rx for hydrocodone and 1 rx for klonopin. Patient states pharmacy has lost the klonopin and patient needs that to be re-written or looked into so she can fill that prescription. States pharmacy will close at 5pm for the holiday.  CB# [phone]

## 2014-06-26 NOTE — Telephone Encounter (Signed)
Pt states she brought the script to [facility] and it was destroyed with the Hydrocodone script already picked up in March. Tried to call pharmacy and message states they are closed all day for the holiday.

## 2014-06-26 NOTE — Telephone Encounter (Signed)
Lm for rtn call- need pharmacy she brought the script to

## 2014-07-21 ENCOUNTER — Ambulatory Visit (INDEPENDENT_AMBULATORY_CARE_PROVIDER_SITE_OTHER): Payer: BC Managed Care – PPO | Admitting: Internal Medicine

## 2014-07-21 VITALS — BP 120/74 | HR 79 | Temp 98.5°F | Resp 16 | Ht 64.65 in | Wt 140.5 lb

## 2014-07-21 DIAGNOSIS — M5136 Other intervertebral disc degeneration, lumbar region: Secondary | ICD-10-CM

## 2014-07-21 DIAGNOSIS — G8929 Other chronic pain: Secondary | ICD-10-CM

## 2014-07-21 DIAGNOSIS — M542 Cervicalgia: Secondary | ICD-10-CM

## 2014-07-21 DIAGNOSIS — F411 Generalized anxiety disorder: Secondary | ICD-10-CM

## 2014-07-21 DIAGNOSIS — M51379 Other intervertebral disc degeneration, lumbosacral region without mention of lumbar back pain or lower extremity pain: Secondary | ICD-10-CM

## 2014-07-21 DIAGNOSIS — F419 Anxiety disorder, unspecified: Secondary | ICD-10-CM

## 2014-07-21 DIAGNOSIS — M5137 Other intervertebral disc degeneration, lumbosacral region: Secondary | ICD-10-CM

## 2014-07-21 MED ORDER — PREDNISONE 20 MG PO TABS: ORAL_TABLET | ORAL | Status: DC

## 2014-07-21 MED ORDER — HYDROCODONE-ACETAMINOPHEN 10-325 MG PO TABS: 1.0000 | ORAL_TABLET | Freq: Four times a day (QID) | ORAL | Status: DC | PRN

## 2014-07-21 MED ORDER — CYCLOBENZAPRINE HCL 10 MG PO TABS: 10.0000 mg | ORAL_TABLET | Freq: Three times a day (TID) | ORAL | Status: DC | PRN

## 2014-07-21 NOTE — Progress Notes (Signed)
Subjective:  This chart was scribed for Carmen Siaobert Pepper Wyndham, MD by Carmen Rasmussen, Medical Scribe. This patient was seen in Room 5 and the patient's care was started at 7:03 PM.   Patient ID: Carmen Rasmussen, female    DOB: 06-Dec-1951, 63 y.o.   MRN: [id number]  HPI HPI Comments: Carmen Rasmussen is a 63 y.o. female who presents to the [facility] complaining of constant, burning back pain radiating to her right hip and right ankle.  She is unable to get up the stairs or lift her leg to get in the bathtub due to pain.  She will experience numbness in her left ankle and left hip at night.  She had an MRI performed last Friday evening which reveals 3 herniated discs.  She was prescribed Prednisone for her back pain and experienced relief to her symptoms.  She would also like a prescription for pain medication to be filled on August 3.  She is currently taking Klonopin and antidepressants.  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    Past Medical History  Diagnosis Date  . Carpal tunnel syndrome   . Edema   . History of shingles   . Tachycardia   . Hyperextension injury of cervical spine   . Hypertension   . Abnormal Pap smear    Past Surgical History  Procedure Laterality Date  . Cesarean section     Family History  Problem Relation Age of Onset  . Asthma Mother   . Thyroid disease Mother   . Diabetes Mother   . Stroke Mother   . Diabetes Father   . Colon cancer Sister   . Hypertension Brother   . Thyroid disease Sister   . Asthma Sister   . Breast cancer Sister   . Colon cancer Sister   . Colon cancer Maternal Grandmother   . Colon cancer Paternal Aunt     throat  . Colon cancer Paternal Uncle     stomach  . Colon cancer Paternal Aunt     kidney   History   Social History  . Marital Status: Divorced    Spouse Name: N/A    Number of Children: 1  . Years of Education: N/A   Occupational History  . bartender     part time    Social History Main Topics  . Smoking status: Never Smoker   . Smokeless tobacco: Never Used  . Alcohol Use: No     Comment: "now and then"  . Drug Use: No  . Sexual Activity: Yes    Birth Control/ Protection: None   Other Topics Concern  . Not on file   Social History Narrative  . No narrative on file   Allergies  Allergen Reactions  . Meloxicam Swelling and Rash    Review of Systems  Musculoskeletal: Positive for arthralgias and gait problem.  Neurological: Positive for numbness.  doing well w/ anxiety at this point   Objective:  Physical Exam  Nursing note and vitals reviewed. Constitutional: She is oriented to person, place, and time. She appears well-developed and well-nourished.  HENT:  Head: Normocephalic and atraumatic.  Eyes: EOM are normal.  Neck: Normal range of motion.  Cardiovascular: Normal rate.   Pulmonary/Chest: Effort normal.  Musculoskeletal: Normal range of motion.  Antalgic gait.  Straight leg raise is positive on the right at 30 degrees.  She has subjective numbness to touch over the lower extremity on  the right.  Neurological: She is alert and oriented to person, place, and time.  Skin: Skin is warm and dry.  Psychiatric: She has a normal mood and affect. Her behavior is normal.      BP 120/74  Pulse 79  Temp(Src) 98.5 F (36.9 C) (Oral)  Resp 16  Ht 5' 4.65" (1.642 m)  Wt 140 lb 8 oz (63.73 kg)  BMI 23.64 kg/m2  SpO2 98% Assessment & Plan:  DDD (degenerative disc disease), lumbar---  Neck pain, chronic -   Anxiety stable  Meds ordered this encounter  Medications  . HYDROcodone-acetaminophen (NORCO) 10-325 MG per tablet    Sig: Take 1 tablet by mouth every 6 (six) hours as needed. For 30d from signing    Dispense:  120 tablet    Refill:  0  . HYDROcodone-acetaminophen (NORCO) 10-325 MG per tablet    Sig: Take 1 tablet by mouth every 6 (six) hours as needed. For 30d after signed    Dispense:  120 tablet    Refill:  0  .  cyclobenzaprine (FLEXERIL) 10 MG tablet    Sig: Take 1 tablet (10 mg total) by mouth 3 (three) times daily as needed for muscle spasms.    Dispense:  90 tablet    Refill:  1  . predniSONE (DELTASONE) 20 MG tablet    Sig: 3/3/2/2/1/1 single daily dose for 6 days    Dispense:  12 tablet    Refill:  0   F/u w/ dr Ethelene Hal for injection  I have completed the patient encounter in its entirety as documented by the scribe, with editing by me where necessary. Carmen Rasmussen, M.D.

## 2014-08-22 ENCOUNTER — Telehealth: Payer: Self-pay

## 2014-08-22 NOTE — Telephone Encounter (Signed)
Dr. Merla Riches,   Patient forgot to have her script for citalopram (CELEXA) 20 MG tablet Filled at [facility] on [address]   [phone]

## 2014-08-25 MED ORDER — CITALOPRAM HYDROBROMIDE 20 MG PO TABS: ORAL_TABLET | ORAL | Status: DC

## 2014-08-25 NOTE — Telephone Encounter (Signed)
Sent RFs and notified pt on VM 

## 2014-08-30 ENCOUNTER — Ambulatory Visit (INDEPENDENT_AMBULATORY_CARE_PROVIDER_SITE_OTHER): Payer: BC Managed Care – PPO | Admitting: Internal Medicine

## 2014-08-30 VITALS — BP 124/74 | HR 69 | Temp 98.5°F | Resp 16 | Ht 66.0 in | Wt 147.0 lb

## 2014-08-30 DIAGNOSIS — M5416 Radiculopathy, lumbar region: Secondary | ICD-10-CM | POA: Insufficient documentation

## 2014-08-30 DIAGNOSIS — F419 Anxiety disorder, unspecified: Secondary | ICD-10-CM

## 2014-08-30 DIAGNOSIS — N951 Menopausal and female climacteric states: Secondary | ICD-10-CM

## 2014-08-30 DIAGNOSIS — M51379 Other intervertebral disc degeneration, lumbosacral region without mention of lumbar back pain or lower extremity pain: Secondary | ICD-10-CM

## 2014-08-30 DIAGNOSIS — F411 Generalized anxiety disorder: Secondary | ICD-10-CM

## 2014-08-30 DIAGNOSIS — G8929 Other chronic pain: Secondary | ICD-10-CM

## 2014-08-30 DIAGNOSIS — M542 Cervicalgia: Secondary | ICD-10-CM

## 2014-08-30 DIAGNOSIS — M5137 Other intervertebral disc degeneration, lumbosacral region: Secondary | ICD-10-CM | POA: Insufficient documentation

## 2014-08-30 DIAGNOSIS — Z780 Asymptomatic menopausal state: Secondary | ICD-10-CM

## 2014-08-30 DIAGNOSIS — IMO0002 Reserved for concepts with insufficient information to code with codable children: Secondary | ICD-10-CM

## 2014-08-30 MED ORDER — PREDNISONE 20 MG PO TABS: ORAL_TABLET | ORAL | Status: DC

## 2014-08-30 MED ORDER — IBUPROFEN 800 MG PO TABS: 800.0000 mg | ORAL_TABLET | Freq: Three times a day (TID) | ORAL | Status: DC | PRN

## 2014-08-30 MED ORDER — HYDROCODONE-ACETAMINOPHEN 10-325 MG PO TABS: 1.0000 | ORAL_TABLET | Freq: Four times a day (QID) | ORAL | Status: DC | PRN

## 2014-08-30 MED ORDER — METHOCARBAMOL 500 MG PO TABS: 500.0000 mg | ORAL_TABLET | Freq: Four times a day (QID) | ORAL | Status: DC

## 2014-08-30 MED ORDER — MEDROXYPROGESTERONE ACETATE 2.5 MG PO TABS: 2.5000 mg | ORAL_TABLET | Freq: Every day | ORAL | Status: DC

## 2014-08-30 MED ORDER — ESTRADIOL 1 MG PO TABS: 1.0000 mg | ORAL_TABLET | Freq: Every day | ORAL | Status: DC

## 2014-08-30 NOTE — Progress Notes (Addendum)
Subjective:  This chart was scribed for Carmen Sia, MD by Modena Jansky, ED Scribe. This patient was seen in room 11 and the patient's care was started at 1:59 PM.   Patient ID: Carmen Rasmussen, female    DOB: [birth year], 63 y.o.   MRN: [id number] Chief Complaint  Patient presents with  . Medication Refill  . Advice on Back    Pt. is still having back pain after seeing several specialists    HPI HPI Comments: Carmen Rasmussen is a 63 y.o. female who presents to the [facility] complaining of medication refill and lower back pain.  Pt reports that she has been having constant moderate lower back pain for a while despite seeing multiple caregivers. She describes the pain as a painful and burning sensation. She states that the pain radiates down her  lower extremities. She reports that sitting exacerbates the pain. She reports having x rays and injections done-and Dr. Ethelene Hal--- the last injection had no results there are positive. She states that her MRI shows disc problems and? stenosis. At our last office visit here she did respond positively to prednisone.  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   She reports that she needs a refill on citalopram which is being used to control her anxiety disorder and reactive depression. She is doing fairly well except for the trouble related to her constant pain.    Pt's Last MRI was 07/17/2014 at [facility].  Impression:  1. L5-S1 5 mm right subarticular disc extrusion extending caudally producing mass effect on the right S1 nerve root. Mild to moderate facet arthropathy also present. 2. Mild disc bulges L4-5 and L1-2 without affecting the nerve roots.  3. Mild retrolisthesis and uncovered/bulging disc L3-4 and L2-3 without affecting the nerve roots.  Patient Active Problem List   Diagnosis Date Noted  . Sciatica of left side 03/24/2014  . Neck pain 04/07/2012  . Neck pain, chronic  04/07/2012  . Tremor 04/07/2012  . Anxiety 04/07/2012  . Palpitations 06/17/2011  . Carpal tunnel syndrome   . Idiopathic edema   . History of shingles    Past Medical History  Diagnosis Date  . Carpal tunnel syndrome   . Edema   . History of shingles   . Tachycardia   . Hyperextension injury of cervical spine   . Hypertension   . Abnormal Pap smear    Past Surgical History  Procedure Laterality Date  . Cesarean section     Allergies  Allergen Reactions  . Meloxicam Swelling and Rash   Prior to Admission medications   Medication Sig Start Date End Date Taking? Authorizing Provider  citalopram (CELEXA) 20 MG tablet TAKE ONE TABLET BY MOUTH ONCE DAILY 08/25/14  Yes Tonye Pearson, MD  clonazePAM (KLONOPIN) 1 MG tablet 1 am, 1 midday, 1-2 hs prn 03/24/14  Yes Tonye Pearson, MD  cyclobenzaprine (FLEXERIL) 10 MG tablet Take 1 tablet (10 mg total) by mouth 3 (three) times daily as needed for muscle spasms. 07/21/14  Yes Tonye Pearson, MD  estradiol (ESTRACE) 1 MG tablet Take 1 mg by mouth daily.    Yes Historical Provider, MD  HYDROcodone-acetaminophen (NORCO) 10-325 MG per tablet Take 1 tablet by mouth every 6 (six) hours as needed. For 30d from signing 07/21/14  Yes Tonye Pearson, MD  HYDROcodone-acetaminophen [facility]) 10-325 MG per tablet Take 1 tablet by mouth every 6 (six)  hours as needed. For 30d after signed 07/21/14  Yes Tonye Pearson, MD  ibuprofen (ADVIL,MOTRIN) 800 MG tablet Take 1 tablet (800 mg total) by mouth every 8 (eight) hours as needed. 09/02/13  Yes Tonye Pearson, MD  methocarbamol (ROBAXIN) 500 MG tablet Take 500 mg by mouth 4 (four) times daily.   Yes Historical Provider, MD  metoprolol tartrate (LOPRESSOR) 25 MG tablet Take 1 -2 tablets by mouth daily.   Yes Tonye Pearson, MD  predniSONE (DELTASONE) 20 MG tablet 3 PO FOR 3 DAYS, 2 PO FOR 3 DAYS, 1 PO FOR 3 DAYS 03/24/14  Yes Tonye Pearson, MD  predniSONE (DELTASONE) 20 MG tablet  3/3/2/2/1/1 single daily dose for 6 days 07/21/14  Yes Tonye Pearson, MD   History   Social History  . Marital Status: Divorced    Spouse Name: N/A    Number of Children: 1  . Years of Education: N/A              Review of Systems  Constitutional: Negative for appetite change, fatigue and unexpected weight change.  Genitourinary: Negative for frequency, difficulty urinating and pelvic pain.       Postmenopausal symptoms and wants to restart hormone therapy as initiated by her gynecologist  Musculoskeletal: Positive for back pain and myalgias.  Neurological: Negative for light-headedness and headaches.       Objective:   Physical Exam  Nursing note and vitals reviewed. Constitutional: She is oriented to person, place, and time. She appears well-developed and well-nourished. She appears distressed.  Distressed to the point of tears at times.  HENT:  Head: Normocephalic and atraumatic.  Neck: Neck supple. No tracheal deviation present.  Cardiovascular: Normal rate.   Pulmonary/Chest: Effort normal. No respiratory distress.  Musculoskeletal: Normal range of motion.  Straight leg raise positive 75 degree on left. Okay to 90 degrees on right.  Equal 2+ patellar reflexes.  Can flex feet against resistance without pain.   Neurological: She is alert and oriented to person, place, and time.  No sensory losses in extremities  Skin: Skin is warm and dry.  Psychiatric: She has a normal mood and affect. Her behavior is normal.    Filed Vitals:   08/30/14 1239  BP: 124/74  Pulse: 69  Temp: 98.5 F (36.9 C)  TempSrc: Oral  Resp: 16  Height:  (1.676 m)  Weight: 147 lb (66.679 kg)  SpO2: 98%       Assessment & Plan:  I have completed the patient encounter in its entirety as documented by the scribe, with editing by me where necessary. Brnadon Eoff P. Merla Riches, M.D.  Anxiety-cont citalopram  Neck pain, chronic -DDD DDD (degenerative disc disease), lumbosacral - Plan:  Ambulatory referral to Physical Therapy--[place] Lumbar radicular pain -   Plan: predniSONE (DELTASONE) 20 MG tablet, ibuprofen (ADVIL,MOTRIN) 800 MG tablet,  HYDROcodone-acetaminophen (NORCO) 10-325 MG per tablet, methocarbamol (ROBAXIN) 500 MG   Consult neurosurgery if no response to physical therapy although she is reluctant to consider surgery  Discussed workplace ergonomics to improve her situation though she is pessimistic that her boss will  allow any changes  Menopause--restart Estrace and progesterone  Followup one to 3 months

## 2014-09-30 ENCOUNTER — Telehealth: Payer: Self-pay

## 2014-09-30 DIAGNOSIS — M5416 Radiculopathy, lumbar region: Secondary | ICD-10-CM

## 2014-09-30 DIAGNOSIS — G8929 Other chronic pain: Secondary | ICD-10-CM

## 2014-09-30 DIAGNOSIS — M542 Cervicalgia: Principal | ICD-10-CM

## 2014-09-30 MED ORDER — HYDROCODONE-ACETAMINOPHEN 10-325 MG PO TABS: 1.0000 | ORAL_TABLET | Freq: Four times a day (QID) | ORAL | Status: DC | PRN

## 2014-09-30 NOTE — Telephone Encounter (Signed)
Pt states Dr Merla Richesoolittle had written her Hydrocodone for September and October, but she can't find the one for October Please call [phone]

## 2014-10-01 ENCOUNTER — Telehealth: Payer: Self-pay

## 2014-10-01 ENCOUNTER — Telehealth: Payer: Self-pay | Admitting: *Deleted

## 2014-10-01 NOTE — Telephone Encounter (Signed)
Patient left voicemail asking for record related to her back and hip from the past year, and also her physical from last January. She needs to sign a release form so medical records can process request. Please call patient. Cb# [phone]

## 2014-10-01 NOTE — Telephone Encounter (Signed)
Please call pt to notify Norco refill is ready for pick up- in pick up drawer.

## 2014-10-01 NOTE — Telephone Encounter (Signed)
Notified pt ready. 

## 2014-10-02 NOTE — Telephone Encounter (Signed)
Called patient who is coming in tonight to complete the medical release form.

## 2014-10-05 ENCOUNTER — Telehealth: Payer: Self-pay | Admitting: Family Medicine

## 2014-10-05 DIAGNOSIS — M542 Cervicalgia: Principal | ICD-10-CM

## 2014-10-05 DIAGNOSIS — G8929 Other chronic pain: Secondary | ICD-10-CM

## 2014-10-05 DIAGNOSIS — M5416 Radiculopathy, lumbar region: Secondary | ICD-10-CM

## 2014-10-05 MED ORDER — HYDROCODONE-ACETAMINOPHEN 10-325 MG PO TABS: 1.0000 | ORAL_TABLET | Freq: Four times a day (QID) | ORAL | Status: DC | PRN

## 2014-10-05 MED ORDER — HYDROCODONE-ACETAMINOPHEN 10-325 MG PO TABS
1.0000 | ORAL_TABLET | Freq: Four times a day (QID) | ORAL | Status: DC | PRN
Start: 2014-10-05 — End: 2014-10-18

## 2014-10-05 NOTE — Telephone Encounter (Signed)
Patient came in to pick up RX and stated she usually gets 3 RX's at once with future fill dates on each of them. Per Dr. Merla Richesoolittle reprint for signature. Patient p/u

## 2014-10-18 ENCOUNTER — Ambulatory Visit (INDEPENDENT_AMBULATORY_CARE_PROVIDER_SITE_OTHER): Payer: BC Managed Care – PPO | Admitting: Internal Medicine

## 2014-10-18 VITALS — BP 108/64 | HR 73 | Temp 98.7°F | Resp 16 | Ht 65.0 in | Wt 149.8 lb

## 2014-10-18 DIAGNOSIS — F419 Anxiety disorder, unspecified: Secondary | ICD-10-CM

## 2014-10-18 DIAGNOSIS — G8929 Other chronic pain: Secondary | ICD-10-CM

## 2014-10-18 DIAGNOSIS — M5137 Other intervertebral disc degeneration, lumbosacral region: Secondary | ICD-10-CM

## 2014-10-18 DIAGNOSIS — M542 Cervicalgia: Secondary | ICD-10-CM

## 2014-10-18 MED ORDER — OXYCODONE-ACETAMINOPHEN 10-325 MG PO TABS: 1.0000 | ORAL_TABLET | Freq: Four times a day (QID) | ORAL | Status: DC | PRN

## 2014-10-18 MED ORDER — CLONAZEPAM 1 MG PO TABS: ORAL_TABLET | ORAL | Status: DC

## 2014-10-18 NOTE — Progress Notes (Signed)
Subjective:    Patient ID: Carmen Rasmussen, female    DOB: 07/09/1951, 63 y.o.   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 Shelle IronBeane in 5 days to see if there are surgical options  Now with relapse of neck pain--hands are struggling to type with popping of 4/5 pip joints Past history of carpal tunnel syndrome but not complaining of numbness in the hands No swelling of joints in the hands  Past hx funny rxn to neurontin so this is not an option She has had prednisone 3 or 4 times since beginning her troubles during this lengthy workup She improves when she takes it but then immediately gets worse again  Patient Active Problem List   Diagnosis Date Noted  . DDD (degenerative disc disease), lumbosacral 08/30/2014    Priority: High  . Neck pain, chronic 04/07/2012    Priority: Medium  . Anxiety 04/07/2012    Priority: Medium  . Lumbar radicular pain 08/30/2014  . Sciatica of left side 03/24/2014  . Neck pain 04/07/2012  . Tremor 04/07/2012  . Palpitations 06/17/2011  . Carpal tunnel syndrome   . Idiopathic edema   . History of shingles    Prior to Admission medications   Medication Sig Start Date End Date Taking? Authorizing Provider  citalopram (CELEXA) 20 MG tablet TAKE ONE TABLET BY MOUTH ONCE DAILY 08/25/14  Yes Tonye Pearsonobert P Roberta Angell, MD  estradiol (ESTRACE) 1 MG tablet Take 1 tablet (1 mg total) by mouth daily. 08/30/14  Yes Tonye Pearsonobert P Shawndale Kilpatrick, MD  ibuprofen (ADVIL,MOTRIN) 800 MG tablet Take 1 tablet (800 mg total) by mouth every 8 (eight) hours as needed. 08/30/14  Yes Tonye Pearsonobert P Mamta Rimmer, MD  methocarbamol (ROBAXIN) 500 MG tablet Take 1 tablet (500 mg total) by mouth 4 (four) times daily. 08/30/14  Yes Tonye Pearsonobert P Benay Pomeroy, MD  metoprolol tartrate (LOPRESSOR) 25 MG tablet Take 1 -2  tablets by mouth daily.   Yes Tonye Pearsonobert P Jaymari Cromie, MD  hydrocod-acetaminophen  10-325 MG per tablet Take 1 tablet by mouth every 6 (six) hours as needed for pain. 08/30/14   Tonye Pearsonobert P Shilo Pauwels, MD    Past Surgical History  Procedure Laterality Date  . Cesarean section      Review of Systems Not taking benzos cause doesn't like feeling groggy--anxiety still episodic Continues with citalopram//rarely takes Klonopin No chest pain or shortness of breath    Objective:   Physical Exam BP 108/64  Pulse 73  Temp(Src) 98.7 F (37.1 C) (Oral)  Resp 16  Ht 5\' 5"  (1.651 m)  Wt 149 lb 12.8 oz (67.949 kg)  BMI 24.93 kg/m2  SpO2 98% Neck range of motion is uncomfortable without radicular symptoms The hands exhibit no motor or sensory loss but exhibit hyper extension of the PIP joints 4 and 5 with popping of the extensor tendons upon flexion/joints are not swollen or inflamed Her gait is normal Straight leg raise on both sides produces discomfort in the lumbar area at 60 She has some subjective decreased sensation in the left anterior thigh but no motor weakness in the lower extremities Flexion is very painful Sitting to standing is painful       Assessment & Plan:  Anxiety - stable for now  DDD (degenerative disc disease), lumbosacral--worsening/now bilat sxtoms  Neck pain, chronic  Handicap sticker  temporary 3 months Meds ordered this encounter  Medications  . oxyCODONE-acetaminophen (PERCOCET) 10-325 MG per tablet--- instead of hydrocodone     Sig: Take 1 tablet by mouth every 6 (six) hours as needed for pain.    Dispense:  60 tablet    Refill:  0   Continue Robaxin when necessary

## 2014-10-26 ENCOUNTER — Ambulatory Visit (INDEPENDENT_AMBULATORY_CARE_PROVIDER_SITE_OTHER): Payer: BC Managed Care – PPO | Admitting: Internal Medicine

## 2014-10-26 ENCOUNTER — Telehealth: Payer: Self-pay

## 2014-10-26 VITALS — BP 130/82 | HR 66 | Temp 98.5°F | Resp 16 | Ht 64.75 in | Wt 151.8 lb

## 2014-10-26 DIAGNOSIS — M5137 Other intervertebral disc degeneration, lumbosacral region: Secondary | ICD-10-CM

## 2014-10-26 MED ORDER — OXYCODONE-ACETAMINOPHEN 10-325 MG PO TABS: 1.0000 | ORAL_TABLET | Freq: Four times a day (QID) | ORAL | Status: DC | PRN

## 2014-10-26 NOTE — Telephone Encounter (Signed)
Pt is needing a referral to dr Venetia Maxonstern

## 2014-10-26 NOTE — Progress Notes (Signed)
Subjective:  This chart was scribed for Carmen Rasmussen Carmen Harjo, MD by Charline BillsEssence Howell, ED Scribe. The patient was seen in room 1. Patient's care was started at 7:23 PM.   Patient ID: Carmen Rasmussen, female    DOB: 23-Apr-1951, 63 y.o.   MRN: [id number]  Chief Complaint  Patient presents with  . Referral    Would like to be referred to Neuro- pt would like to see Dr. Venetia MaxonStern, unsure whether she needs surgical clearance at this time    HPI HPI Comments: Carmen Rasmussen is a 63 y.o. female, with a h/o ruptured lumbar disc w/ radic sxtoms that are worsening, who presents to the [facility] for a referral to a neurosurgeon. She expresses her desire to see Dr. Venetia MaxonStern or partners for consideration of back surgery. She saw Dr Shelle IronBeane today who rec surgery but she rightfully wants a second opinion. She denies the burning sensation in her back at this time because meds have helped- the prescribed pain medication has provided relief. Takes as seldom as possible-sleeping better  Past Medical History  Diagnosis Date  . Carpal tunnel syndrome   . Edema   . History of shingles   . Tachycardia   . Hyperextension injury of cervical spine   . Hypertension   . Abnormal Pap smear   . Ruptured lumbar disc    Current Outpatient Prescriptions on File Prior to Visit  Medication Sig Dispense Refill  . citalopram (CELEXA) 20 MG tablet TAKE ONE TABLET BY MOUTH ONCE DAILY 30 tablet 4  . estradiol (ESTRACE) 1 MG tablet Take 1 tablet (1 mg total) by mouth daily. 30 tablet 11  . ibuprofen (ADVIL,MOTRIN) 800 MG tablet Take 1 tablet (800 mg total) by mouth every 8 (eight) hours as needed. 90 tablet 5  . methocarbamol (ROBAXIN) 500 MG tablet Take 1 tablet (500 mg total) by mouth 4 (four) times daily. 120 tablet 3  . metoprolol tartrate (LOPRESSOR) 25 MG tablet Take 1 -2 tablets by mouth daily. 60 tablet 0  . oxyCODONE-acetaminophen (PERCOCET) 10-325 MG per tablet Take 1 tablet by mouth every 6 (six) hours as  needed for pain. 60 tablet 0   No current facility-administered medications on file prior to visit.   Allergies  Allergen Reactions  . Meloxicam Swelling and Rash   Review of Systems  Constitutional: Negative for fever, chills and fatigue.      Objective:   Physical Exam  Constitutional: She is oriented to person, place, and time. She appears well-developed and well-nourished. No distress.  HENT:  Head: Normocephalic and atraumatic.  Eyes: Conjunctivae and EOM are normal. Pupils are equal, round, and reactive to light.  Neck: Neck supple.  Cardiovascular: Normal rate and regular rhythm.   No murmur heard. Pulmonary/Chest: Effort normal and breath sounds normal. No respiratory distress.  Musculoskeletal:  Gait stable  Neurological: She is alert and oriented to person, place, and time. She has normal reflexes. No cranial nerve deficit.  Skin: Skin is warm and dry.  Psychiatric: She has a normal mood and affect. Her behavior is normal.  Nursing note and vitals reviewed.  Triage Vitals: BP 130/82 mmHg  Pulse 66  Temp(Src) 98.5 F (36.9 C) (Oral)  Resp 16  Ht 5' 4.75" (1.645 m)  Wt 151 lb 12.8 oz (68.856 kg)  BMI 25.45 kg/m2  SpO2 98%    Assessment & Plan:   I personally performed the services described in this documentation, which was scribed in my  presence. The recorded information has been reviewed and is accurate.  DDD (degenerative disc disease), lumbosacral - Plan: Ambulatory referral to Neurosurgery  meds given postdated Ref to NS I consider her cleared surgery based on this exam.

## 2014-11-02 ENCOUNTER — Telehealth: Payer: Self-pay
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
## 2014-11-04 NOTE — Telephone Encounter (Signed)
Agree--any NS in [place] ok

## 2014-11-09 ENCOUNTER — Ambulatory Visit: Payer: Self-pay | Admitting: Orthopedic Surgery

## 2014-11-11 ENCOUNTER — Telehealth: Payer: Self-pay

## 2014-11-11 NOTE — Telephone Encounter (Signed)
Advise her to take the 1st appt she can get and then worry re scheduling the surgery later--or we might try going thru ortho instead

## 2014-11-11 NOTE — Telephone Encounter (Signed)
Lurena JoinerRebecca from [facility] offered patient an appointment for 12/11/14. Patient wants surgery to be done by the end of the year. Which may not be likely per their office. This is the third time we have changed her referral to another neurosurgery office, because patient is upset that either locations promise to do surgery this year. Please advise. This keeps delaying her appointments and what she wants (which is surgery). Please explain to patient. Toni AmendCourtney has spoken to patient many times about this.

## 2014-11-11 NOTE — Telephone Encounter (Signed)
[facility] called to ask if we can get [facility] to give them or our office the MRI results. Patient was referred to [facility] for [facility] surgery but they can not see why without mri results.  Ortho refuses to give them results because they are not referring provider. Please help; thank you. Patient has an appointment tomorrow with [facility].   If you need to speak to someone at [facility] her name is rebecca: [phone] Fax: [phone]

## 2014-11-11 NOTE — Telephone Encounter (Signed)
Printed MRI results and faxed as requested- we referred her in September.

## 2014-11-12 NOTE — Telephone Encounter (Signed)
Pt advised- she has already worked out the schedule for this to be completed this year. Pt will be in next week to see Dr. Merla Richesoolittle.

## 2014-11-24 ENCOUNTER — Encounter: Payer: Self-pay | Admitting: Internal Medicine

## 2014-11-30 ENCOUNTER — Telehealth: Payer: Self-pay

## 2014-11-30 NOTE — Telephone Encounter (Signed)
Pt called and left a message with MR stating that she will be coming in to be seen this evening and that she has appointments at another facility soon. States she is wondering why [facility] of [place] [facility]. Please advise.

## 2014-12-01 ENCOUNTER — Ambulatory Visit: Payer: Self-pay | Admitting: Orthopedic Surgery

## 2014-12-01 NOTE — Telephone Encounter (Signed)
Returned call to patient. I explained to her that we have only been on EMR for the last 3 years and anything prior to that is still in her paper chart. Patient understood. [facility] and requests copies of records November 2014-present for pick up. She will pick up records this weekend and sign ROI form then. Will print records today and place in pick up drawer with ROI form.

## 2014-12-01 NOTE — H&P (Signed)
Carmen Rasmussen is an [age] y.o. female.   Chief Complaint: back and B/L leg pain HPI: The patient is a 63 year old female who presents today for follow up of their back. The patient is being followed for their back pain. They are now 3 1/2 months out from when symptoms began. Symptoms reported today include: pain, numbness (legs), burning (feet), leg pain (bilateral upper legs) and pain with standing. The patient states that they are doing poorly. The following medication has been used for pain control: Percocet (prn) and Norco and Robaxin. The patient reports their current pain level to be severe. The patient presents today following MRI (11/13/2014).  Carmen Rasmussen follows up. She has pain in the left greater than right. She had the MRI of her lumbar spine indicating no evidence of a lesion compressing the cauda equina. She does have significant bilateral lateral recessed stenosis. Indicated the disc protrusion is no longer present, however it is present on the T2 image on 18/23 as well as hypertrophic ligamentum, 3-4 and 4-5 is okay. She has a disc herniation at 2-3 to the left, but has no anterior thigh pain.  Past Medical History  Diagnosis Date  . Carpal tunnel syndrome   . Edema   . History of shingles   . Tachycardia   . Hyperextension injury of cervical spine   . Hypertension   . Abnormal Pap smear   . Ruptured lumbar disc     Past Surgical History  Procedure Laterality Date  . Cesarean section      Family History  Problem Relation Age of Onset  . Asthma Mother   . Thyroid disease Mother   . Diabetes Mother   . Stroke Mother   . Diabetes Father   . Colon cancer Sister   . Hypertension Brother   . Thyroid disease Sister   . Asthma Sister   . Breast cancer Sister   . Colon cancer Sister   . Colon cancer Maternal Grandmother   . Colon cancer Paternal Aunt     throat  . Colon cancer Paternal Uncle     stomach  . Colon cancer Paternal Aunt     kidney   Social History:   reports that she has never smoked. She has never used smokeless tobacco. She reports that she does not drink alcohol or use illicit drugs.  Allergies:  Allergies  Allergen Reactions  . Meloxicam Swelling and Rash     (Not in a hospital admission)  No results found for this or any previous visit (from the past 48 hour(s)). No results found.  Review of Systems  Constitutional: Negative.   HENT: Negative.   Eyes: Negative.   Respiratory: Negative.   Cardiovascular: Negative.   Gastrointestinal: Negative.   Genitourinary: Negative.   Musculoskeletal: Positive for back pain.  Skin: Negative.   Neurological: Positive for sensory change and focal weakness.  Psychiatric/Behavioral: Negative.     There were no vitals taken for this visit. Physical Exam  Musculoskeletal:  On exam, she reports her legs get heavy with long walking and straight leg raise with buttock, thigh, and calf pain on the right, buttock and thigh pain on the left. EHL weakness is noted. Pain with extension.  Lumbar spine exam reveals no evidence of soft tissue swelling, deformity or skin ecchymosis. On palpation there is no tenderness of the lumbar spine. No flank pain with percussion. The abdomen is soft and nontender. Nontender over the trochanters. No cellulitis or lymphadenopathy.  Motor  is 5/5 including tibialis anterior, plantar flexion, quadriceps and hamstrings. Patient is normoreflexic. There is no Babinski or clonus. Sensory exam is intact to light touch. Patient has good distal pulses. No DVT. No pain and normal range of motion without instability of the hips, knees and ankles.     Assessment/Plan 1. Bilateral S1 radiculopathy secondary to lateral recessed stenosis, disc herniation 5-1 to the right.  2. Disc degeneration 3-4 and 4-5. No evidence of cauda equina lesion.  Neurologic exam, his cranial sensation is intact. No neurocompressive lesion. We discussed having to see her neurologist. No  burning dysuria. Refilled her medicines. She is scheduled for a decompression on the right. I do feel given her symptomatology, it would be appropriate to consider decompression bilaterally at 5-1. If she has any changes in the interim, she is to call.  We discussed pathology, relevant anatomy, and treatment options. She has certainly exhausted conservative treatment with epidural, physical therapy, activity modifications in the presence of a neurocompressive lesion, straight leg raise, and EHL weakness, plantar flexion weakness. We discussed option, which would be lumbar decompression and she would like to proceed with that. Also, though, this would not address back pain related to her multilevel disc degeneration, and may require fusion in the future and she understands. We will proceed accordingly.  I had an extensive discussion of the risks and benefits of the lumbar decompression with the patient including bleeding, infection, damage to neurovascular structures, epidural fibrosis, CSF leak requiring repair. We also discussed increase in pain, adjacent segment disease, recurrent disc herniation, need for future surgery including repeat decompression and/or fusion. We also discussed risks of postoperative hematoma, paralysis, anesthetic complications including DVT, PE, death, cardiopulmonary dysfunction. In addition, the perioperative and postoperative courses were discussed in detail including the rehabilitative time and return to functional activity and work. I provided the patient with an illustrated handout and utilized the appropriate surgical models.  Plan microlumbar decompression L5-S1 right, possible bilateral  BISSELL, JACLYN M.  PA-C for Dr. Shelle IronBeane  12/01/2014, 1:05 PM

## 2014-12-06 ENCOUNTER — Ambulatory Visit (INDEPENDENT_AMBULATORY_CARE_PROVIDER_SITE_OTHER): Payer: BC Managed Care – PPO | Admitting: Internal Medicine

## 2014-12-06 VITALS — BP 108/74 | HR 69 | Temp 98.1°F | Resp 18 | Ht 65.0 in | Wt 148.0 lb

## 2014-12-06 DIAGNOSIS — M542 Cervicalgia: Secondary | ICD-10-CM

## 2014-12-06 DIAGNOSIS — M5416 Radiculopathy, lumbar region: Secondary | ICD-10-CM

## 2014-12-06 DIAGNOSIS — M5137 Other intervertebral disc degeneration, lumbosacral region: Secondary | ICD-10-CM

## 2014-12-06 DIAGNOSIS — G8929 Other chronic pain: Secondary | ICD-10-CM

## 2014-12-06 DIAGNOSIS — F419 Anxiety disorder, unspecified: Secondary | ICD-10-CM

## 2014-12-06 MED ORDER — OXYCODONE-ACETAMINOPHEN 10-325 MG PO TABS: 1.0000 | ORAL_TABLET | Freq: Four times a day (QID) | ORAL | Status: DC | PRN

## 2014-12-06 MED ORDER — HYDROCODONE-ACETAMINOPHEN 10-325 MG PO TABS: 1.0000 | ORAL_TABLET | Freq: Four times a day (QID) | ORAL | Status: DC | PRN

## 2014-12-06 MED ORDER — METHOCARBAMOL 500 MG PO TABS: 500.0000 mg | ORAL_TABLET | Freq: Four times a day (QID) | ORAL | Status: DC

## 2014-12-06 NOTE — Progress Notes (Signed)
F/u Neck pain, chronic   Lumbar radicular pain due to DDD (degenerative disc disease), lumbosacral  Anxiety  Burning numbness in back w/ Rad to hips and legs See MRI results Surgery sched 12/30 Dr Shelle IronBeane Needs med refills for pain til then  anx stable  Edema last wk 3d ? Why now resolv---no CP/DOE/Palpit   BP 108/74 mmHg  Pulse 69  Temp(Src) 98.1 F (36.7 C) (Oral)  Resp 18  Ht 5\' 5"  (1.651 m)  Wt 148 lb (67.132 kg)  BMI 24.63 kg/m2  SpO2 99% Alert/oriented/in noAD Neck and back range of motion limited as in past  I/P Neck pain, chronic - Plan: HYDROcodone-acetaminophen (NORCO) 10-325 MG per tablet, HYDROcodone-acetaminophen (NORCO) 10-325 MG per tablet  Lumbar radicular pain - Plan: HYDROcodone-acetaminophen (NORCO) 10-325 MG per tablet, HYDROcodone-acetaminophen (NORCO) 10-325 MG per tablet, methocarbamol (ROBAXIN) 500 MG tablet  DDD (degenerative disc disease), lumbosacral  Anxiety  She is discouraged and starting to consider full disability application Meds ordered this encounter  Medications  . ClonazePAM (KLONOPIN PO)    Sig: Take by mouth.  Marland Kitchen. HYDROCODONE-ACETAMINOPHEN PO    Sig: Take by mouth.  . oxyCODONE-acetaminophen (PERCOCET) 10-325 MG per tablet    Sig: Take 1 tablet by mouth every 6 (six) hours as needed for pain.    Dispense:  60 tablet    Refill:  0  . HYDROcodone-acetaminophen (NORCO) 10-325 MG per tablet    Sig: Take 1 tablet by mouth every 6 (six) hours as needed.    Dispense:  120 tablet    Refill:  0  . HYDROcodone-acetaminophen (NORCO) 10-325 MG per tablet    Sig: Take 1 tablet by mouth every 6 (six) hours as needed. For 01/06/15 or after    Dispense:  120 tablet    Refill:  0  . methocarbamol (ROBAXIN) 500 MG tablet    Sig: Take 1 tablet (500 mg total) by mouth 4 (four) times daily.    Dispense:  120 tablet    Refill:  3   F/u 2 mos///handicap sticker for 3 months to 6 months for post surgery.

## 2014-12-14 ENCOUNTER — Ambulatory Visit (HOSPITAL_COMMUNITY)
Admission: RE | Admit: 2014-12-14 | Discharge: 2014-12-14 | Disposition: A | Discharge status: Home / Self Care | Payer: BC Managed Care – PPO | Source: Ambulatory Visit | Attending: Anesthesiology | Admitting: Anesthesiology

## 2014-12-14 ENCOUNTER — Encounter (HOSPITAL_COMMUNITY): Payer: Self-pay

## 2014-12-14 ENCOUNTER — Encounter (HOSPITAL_COMMUNITY)
Admission: RE | Admit: 2014-12-14 | Discharge: 2014-12-14 | Disposition: A | Discharge status: Home / Self Care | Payer: BC Managed Care – PPO | Source: Ambulatory Visit | Attending: Specialist | Admitting: Specialist

## 2014-12-14 DIAGNOSIS — I10 Essential (primary) hypertension: Secondary | ICD-10-CM | POA: Insufficient documentation

## 2014-12-14 DIAGNOSIS — Z01818 Encounter for other preprocedural examination: Secondary | ICD-10-CM | POA: Insufficient documentation

## 2014-12-14 DIAGNOSIS — Z8679 Personal history of other diseases of the circulatory system: Secondary | ICD-10-CM

## 2014-12-14 HISTORY — DX: Anxiety disorder, unspecified: F41.9

## 2014-12-14 HISTORY — DX: Unspecified osteoarthritis, unspecified site: M19.90

## 2014-12-14 HISTORY — DX: Gastro-esophageal reflux disease without esophagitis: K21.9

## 2014-12-14 LAB — BASIC METABOLIC PANEL
Anion gap: 12 (ref 5–15)
BUN: 13 mg/dL (ref 6–23)
CO2: 27 mEq/L (ref 19–32)
Calcium: 10 mg/dL (ref 8.4–10.5)
Chloride: 100 mEq/L (ref 96–112)
Creatinine, Ser: 1.04 mg/dL (ref 0.50–1.10)
GFR calc Af Amer: 66 mL/min — ABNORMAL LOW (ref >90)
GFR calc non Af Amer: 57 mL/min — ABNORMAL LOW (ref >90)
Glucose, Bld: 95 mg/dL (ref 70–99)
Potassium: 5.2 mEq/L (ref 3.7–5.3)
Sodium: 139 mEq/L (ref 137–147)

## 2014-12-14 LAB — CBC
HCT: 41.1 % (ref 36.0–46.0)
Hemoglobin: 13.6 g/dL (ref 12.0–15.0)
MCH: 32.2 pg (ref 26.0–34.0)
MCHC: 33.1 g/dL (ref 30.0–36.0)
MCV: 97.4 fL (ref 78.0–100.0)
Platelets: 248 10*3/uL (ref 150–400)
RBC: 4.22 MIL/uL (ref 3.87–5.11)
RDW: 12.3 % (ref 11.5–15.5)
WBC: 6.4 10*3/uL (ref 4.0–10.5)

## 2014-12-14 LAB — SURGICAL PCR SCREEN
MRSA, PCR: NEGATIVE
Staphylococcus aureus: NEGATIVE

## 2014-12-14 NOTE — Progress Notes (Signed)
Consent order has abbreviations. Please correct. Thanks.

## 2014-12-14 NOTE — Patient Instructions (Signed)
Carmen Rasmussen  12/14/2014   Your procedure is scheduled on: Wednesday, December 23, 2014  Report [facility]  Entrance and follow signs to               [facility] arrive at 0530 AM.  Call this number if you have problems the morning of surgery [phone]   Remember:  Do not eat food or drink liquids :After Midnight.     Take these medicines the morning of surgery with A SIP OF WATER: citalopram;clonazepam if needed;hydrocodone-acetaminophen if needed                                You may not have any metal on your body including hair pins and              piercings  Do not wear jewelry, make-up, lotions, powders or perfumes.             Do not wear nail polish.  Do not shave  48 hours prior to surgery.                Do not bring valuables to the hospital. [facility] IS NOT             RESPONSIBLE   FOR VALUABLES.  Contacts, dentures or bridgework may not be worn into surgery.  Leave suitcase in the car. After surgery it may be brought to your room.                Please read over the following fact sheets you were given:Incentive Spirometer _____________________________________________________________________             [facility] - Preparing for Surgery Before surgery, you can play an important role.  Because skin is not sterile, your skin needs to be as free of germs as possible.  You can reduce the number of germs on your skin by washing with CHG (chlorahexidine gluconate) soap before surgery.  CHG is an antiseptic cleaner which kills germs and bonds with the skin to continue killing germs even after washing. Please DO NOT use if you have an allergy to CHG or antibacterial soaps.  If your skin becomes reddened/irritated stop using the CHG and inform your nurse when you arrive at Short Stay. Do not shave (including legs and underarms) for at least 48 hours prior to the first CHG shower.  You may shave your face/neck. Please follow these  instructions carefully:  1.  Shower with CHG Soap the night before surgery and the  morning of Surgery.  2.  If you choose to wash your hair, wash your hair first as usual with your  normal  shampoo.  3.  After you shampoo, rinse your hair and body thoroughly to remove the  shampoo.                           4.  Use CHG as you would any other liquid soap.  You can apply chg directly  to the skin and wash                       Gently with a scrungie or clean washcloth.  5.  Apply the CHG Soap to your body ONLY FROM THE NECK DOWN.   Do not  use on face/ open                           Wound or open sores. Avoid contact with eyes, ears mouth and genitals (private parts).                       Wash face,  Genitals (private parts) with your normal soap.             6.  Wash thoroughly, paying special attention to the area where your surgery  will be performed.  7.  Thoroughly rinse your body with warm water from the neck down.  8.  DO NOT shower/wash with your normal soap after using and rinsing off  the CHG Soap.                9.  Pat yourself dry with a clean towel.            10.  Wear clean pajamas.            11.  Place clean sheets on your bed the night of your first shower and do not  sleep with pets. Day of Surgery : Do not apply any lotions/deodorants the morning of surgery.  Please wear clean clothes to the hospital/surgery center.  FAILURE TO FOLLOW THESE INSTRUCTIONS MAY RESULT IN THE CANCELLATION OF YOUR SURGERY PATIENT SIGNATURE_________________________________  NURSE SIGNATURE__________________________________  ________________________________________________________________________   Adam Phenix  An incentive spirometer is a tool that can help keep your lungs clear and active. This tool measures how well you are filling your lungs with each breath. Taking long deep breaths may help reverse or decrease the chance of developing breathing (pulmonary) problems (especially  infection) following:  A long period of time when you are unable to move or be active. BEFORE THE PROCEDURE   If the spirometer includes an indicator to show your best effort, your nurse or respiratory therapist will set it to a desired goal.  If possible, sit up straight or lean slightly forward. Try not to slouch.  Hold the incentive spirometer in an upright position. INSTRUCTIONS FOR USE  1. Sit on the edge of your bed if possible, or sit up as far as you can in bed or on a chair. 2. Hold the incentive spirometer in an upright position. 3. Breathe out normally. 4. Place the mouthpiece in your mouth and seal your lips tightly around it. 5. Breathe in slowly and as deeply as possible, raising the piston or the ball toward the top of the column. 6. Hold your breath for 3-5 seconds or for as long as possible. Allow the piston or ball to fall to the bottom of the column. 7. Remove the mouthpiece from your mouth and breathe out normally. 8. Rest for a few seconds and repeat Steps 1 through 7 at least 10 times every 1-2 hours when you are awake. Take your time and take a few normal breaths between deep breaths. 9. The spirometer may include an indicator to show your best effort. Use the indicator as a goal to work toward during each repetition. 10. After each set of 10 deep breaths, practice coughing to be sure your lungs are clear. If you have an incision (the cut made at the time of surgery), support your incision when coughing by placing a pillow or rolled up towels firmly against it. Once you are able to get out of bed, walk  around indoors and cough well. You may stop using the incentive spirometer when instructed by your caregiver.  RISKS AND COMPLICATIONS  Take your time so you do not get dizzy or light-headed.  If you are in pain, you may need to take or ask for pain medication before doing incentive spirometry. It is harder to take a deep breath if you are having pain. AFTER  USE  Rest and breathe slowly and easily.  It can be helpful to keep track of a log of your progress. Your caregiver can provide you with a simple table to help with this. If you are using the spirometer at home, follow these instructions: [place] IF:   You are having difficultly using the spirometer.  You have trouble using the spirometer as often as instructed.  Your pain medication is not giving enough relief while using the spirometer.  You develop fever of 100.5 F (38.1 C) or higher. SEEK IMMEDIATE MEDICAL CARE IF:   You cough up bloody sputum that had not been present before.  You develop fever of 102 F (38.9 C) or greater.  You develop worsening pain at or near the incision site. MAKE SURE YOU:   Understand these instructions.  Will watch your condition.  Will get help right away if you are not doing well or get worse. Document Released: 04/23/2007 Document Revised: 03/04/2012 Document Reviewed: 06/24/2007 [facility] Patient Information 2014 [place], [place].   ________________________________________________________________________

## 2014-12-15 ENCOUNTER — Ambulatory Visit: Payer: Self-pay | Admitting: Orthopedic Surgery

## 2014-12-23 ENCOUNTER — Ambulatory Visit (HOSPITAL_COMMUNITY): Payer: BC Managed Care – PPO

## 2014-12-23 ENCOUNTER — Encounter (HOSPITAL_COMMUNITY): Payer: Self-pay | Admitting: *Deleted

## 2014-12-23 ENCOUNTER — Ambulatory Visit (HOSPITAL_COMMUNITY): Payer: BC Managed Care – PPO | Admitting: Registered Nurse

## 2014-12-23 ENCOUNTER — Ambulatory Visit (HOSPITAL_COMMUNITY)
Admission: RE | Admit: 2014-12-23 | Discharge: 2014-12-24 | Disposition: A | Discharge status: Home / Self Care | Payer: BC Managed Care – PPO | Source: Ambulatory Visit | Attending: Specialist | Admitting: Specialist

## 2014-12-23 ENCOUNTER — Encounter (HOSPITAL_COMMUNITY)
Admission: RE | Disposition: A | Discharge status: Home / Self Care | Payer: Self-pay | Source: Ambulatory Visit | Attending: Specialist

## 2014-12-23 DIAGNOSIS — K219 Gastro-esophageal reflux disease without esophagitis: Secondary | ICD-10-CM | POA: Diagnosis not present

## 2014-12-23 DIAGNOSIS — F419 Anxiety disorder, unspecified: Secondary | ICD-10-CM | POA: Diagnosis not present

## 2014-12-23 DIAGNOSIS — Z888 Allergy status to other drugs, medicaments and biological substances status: Secondary | ICD-10-CM | POA: Insufficient documentation

## 2014-12-23 DIAGNOSIS — M1990 Unspecified osteoarthritis, unspecified site: Secondary | ICD-10-CM | POA: Diagnosis not present

## 2014-12-23 DIAGNOSIS — M4806 Spinal stenosis, lumbar region: Secondary | ICD-10-CM | POA: Insufficient documentation

## 2014-12-23 DIAGNOSIS — M5126 Other intervertebral disc displacement, lumbar region: Secondary | ICD-10-CM

## 2014-12-23 DIAGNOSIS — G5600 Carpal tunnel syndrome, unspecified upper limb: Secondary | ICD-10-CM | POA: Diagnosis not present

## 2014-12-23 DIAGNOSIS — Z419 Encounter for procedure for purposes other than remedying health state, unspecified: Secondary | ICD-10-CM

## 2014-12-23 DIAGNOSIS — M5117 Intervertebral disc disorders with radiculopathy, lumbosacral region: Secondary | ICD-10-CM | POA: Insufficient documentation

## 2014-12-23 HISTORY — PX: LUMBAR LAMINECTOMY/DECOMPRESSION MICRODISCECTOMY: SHX5026

## 2014-12-23 SURGERY — LUMBAR LAMINECTOMY/DECOMPRESSION MICRODISCECTOMY 1 LEVEL
Anesthesia: General | Laterality: Right

## 2014-12-23 MED ORDER — OXYCODONE-ACETAMINOPHEN 5-325 MG PO TABS: 1.0000 | ORAL_TABLET | ORAL | Status: DC | PRN

## 2014-12-23 MED ORDER — SODIUM CHLORIDE 0.9 % IR SOLN
Status: DC | PRN
  Administered 2014-12-23: 500 mL

## 2014-12-23 MED ORDER — CEFAZOLIN SODIUM-DEXTROSE 2-3 GM-% IV SOLR
INTRAVENOUS | Status: AC
  Filled 2014-12-23: qty 50

## 2014-12-23 MED ORDER — GLYCOPYRROLATE 0.2 MG/ML IJ SOLN
INTRAMUSCULAR | Status: DC | PRN
  Administered 2014-12-23: 0.6 mg via INTRAVENOUS

## 2014-12-23 MED ORDER — FLEET ENEMA 7-19 GM/118ML RE ENEM: 1.0000 | ENEMA | Freq: Once | RECTAL | Status: AC | PRN

## 2014-12-23 MED ORDER — FENTANYL CITRATE 0.05 MG/ML IJ SOLN
INTRAMUSCULAR | Status: AC
  Filled 2014-12-23: qty 5

## 2014-12-23 MED ORDER — PROPOFOL 10 MG/ML IV BOLUS
INTRAVENOUS | Status: AC
  Filled 2014-12-23: qty 20

## 2014-12-23 MED ORDER — DOCUSATE SODIUM 100 MG PO CAPS: 100.0000 mg | ORAL_CAPSULE | Freq: Two times a day (BID) | ORAL | Status: DC

## 2014-12-23 MED ORDER — HYDROMORPHONE HCL 1 MG/ML IJ SOLN
INTRAMUSCULAR | Status: AC
  Filled 2014-12-23: qty 1

## 2014-12-23 MED ORDER — ACETAMINOPHEN 10 MG/ML IV SOLN
1000.0000 mg | Freq: Once | INTRAVENOUS | Status: AC
  Administered 2014-12-23: 1000 mg via INTRAVENOUS
  Filled 2014-12-23: qty 100

## 2014-12-23 MED ORDER — ROCURONIUM BROMIDE 100 MG/10ML IV SOLN
INTRAVENOUS | Status: DC | PRN
  Administered 2014-12-23: 5 mg via INTRAVENOUS
  Administered 2014-12-23: 35 mg via INTRAVENOUS

## 2014-12-23 MED ORDER — MIDAZOLAM HCL 2 MG/2ML IJ SOLN
INTRAMUSCULAR | Status: AC
  Filled 2014-12-23: qty 2

## 2014-12-23 MED ORDER — ALUM & MAG HYDROXIDE-SIMETH 200-200-20 MG/5ML PO SUSP: 30.0000 mL | Freq: Four times a day (QID) | ORAL | Status: DC | PRN

## 2014-12-23 MED ORDER — MIDAZOLAM HCL 5 MG/5ML IJ SOLN
INTRAMUSCULAR | Status: DC | PRN
  Administered 2014-12-23: 2 mg via INTRAVENOUS
  Administered 2014-12-23: 1 mg via INTRAVENOUS

## 2014-12-23 MED ORDER — PHENOL 1.4 % MT LIQD
1.0000 | OROMUCOSAL | Status: DC | PRN
  Filled 2014-12-23: qty 177

## 2014-12-23 MED ORDER — LIDOCAINE HCL (CARDIAC) 20 MG/ML IV SOLN
INTRAVENOUS | Status: DC | PRN
  Administered 2014-12-23: 75 mg via INTRAVENOUS
  Administered 2014-12-23: 25 mg via INTRATRACHEAL

## 2014-12-23 MED ORDER — GLYCOPYRROLATE 0.2 MG/ML IJ SOLN
INTRAMUSCULAR | Status: AC
  Filled 2014-12-23: qty 1

## 2014-12-23 MED ORDER — SENNOSIDES-DOCUSATE SODIUM 8.6-50 MG PO TABS: 1.0000 | ORAL_TABLET | Freq: Every evening | ORAL | Status: DC | PRN

## 2014-12-23 MED ORDER — MEDROXYPROGESTERONE ACETATE 2.5 MG PO TABS
2.5000 mg | ORAL_TABLET | Freq: Every day | ORAL | Status: DC
  Administered 2014-12-24: 2.5 mg via ORAL
  Filled 2014-12-23 (×2): qty 1

## 2014-12-23 MED ORDER — ACETAMINOPHEN 650 MG RE SUPP: 650.0000 mg | RECTAL | Status: DC | PRN

## 2014-12-23 MED ORDER — SODIUM CHLORIDE 0.9 % IV SOLN: 250.0000 mL | INTRAVENOUS | Status: DC

## 2014-12-23 MED ORDER — METHOCARBAMOL 500 MG PO TABS
500.0000 mg | ORAL_TABLET | Freq: Four times a day (QID) | ORAL | Status: DC | PRN
  Administered 2014-12-23 – 2014-12-24 (×2): 500 mg via ORAL
  Filled 2014-12-23 (×2): qty 1

## 2014-12-23 MED ORDER — HYDROMORPHONE HCL 2 MG/ML IJ SOLN
INTRAMUSCULAR | Status: AC
  Filled 2014-12-23: qty 1

## 2014-12-23 MED ORDER — ONDANSETRON HCL 4 MG/2ML IJ SOLN: 4.0000 mg | INTRAMUSCULAR | Status: DC | PRN

## 2014-12-23 MED ORDER — SODIUM CHLORIDE 0.9 % IJ SOLN: 3.0000 mL | Freq: Two times a day (BID) | INTRAMUSCULAR | Status: DC

## 2014-12-23 MED ORDER — DEXAMETHASONE SODIUM PHOSPHATE 10 MG/ML IJ SOLN
INTRAMUSCULAR | Status: AC
Start: 2014-12-23 — End: 2014-12-23
  Filled 2014-12-23: qty 1

## 2014-12-23 MED ORDER — THROMBIN 5000 UNITS EX SOLR
OROMUCOSAL | Status: DC | PRN
  Administered 2014-12-23: 08:00:00 via TOPICAL

## 2014-12-23 MED ORDER — METHOCARBAMOL 500 MG PO TABS: 500.0000 mg | ORAL_TABLET | Freq: Three times a day (TID) | ORAL | Status: DC | PRN

## 2014-12-23 MED ORDER — DEXAMETHASONE SODIUM PHOSPHATE 10 MG/ML IJ SOLN
INTRAMUSCULAR | Status: DC | PRN
  Administered 2014-12-23: 10 mg via INTRAVENOUS

## 2014-12-23 MED ORDER — HYDROCODONE-ACETAMINOPHEN 5-325 MG PO TABS: 1.0000 | ORAL_TABLET | ORAL | Status: DC | PRN

## 2014-12-23 MED ORDER — DOCUSATE SODIUM 100 MG PO CAPS: 100.0000 mg | ORAL_CAPSULE | Freq: Two times a day (BID) | ORAL | Status: DC | PRN

## 2014-12-23 MED ORDER — LIDOCAINE HCL (CARDIAC) 20 MG/ML IV SOLN
INTRAVENOUS | Status: AC
Start: 2014-12-23 — End: 2014-12-23
  Filled 2014-12-23: qty 5

## 2014-12-23 MED ORDER — HYDROMORPHONE HCL 1 MG/ML IJ SOLN
0.2500 mg | INTRAMUSCULAR | Status: DC | PRN
  Administered 2014-12-23: 1 mg via INTRAVENOUS
  Administered 2014-12-23: 0.5 mg via INTRAVENOUS
  Administered 2014-12-23: 1 mg via INTRAVENOUS
  Administered 2014-12-23: 0.5 mg via INTRAVENOUS

## 2014-12-23 MED ORDER — CLONAZEPAM 1 MG PO TABS: 1.0000 mg | ORAL_TABLET | Freq: Two times a day (BID) | ORAL | Status: DC | PRN

## 2014-12-23 MED ORDER — METHOCARBAMOL 1000 MG/10ML IJ SOLN
500.0000 mg | Freq: Four times a day (QID) | INTRAMUSCULAR | Status: DC | PRN
  Administered 2014-12-23: 500 mg via INTRAVENOUS
  Filled 2014-12-23 (×2): qty 5

## 2014-12-23 MED ORDER — LACTATED RINGERS IV SOLN: INTRAVENOUS | Status: DC

## 2014-12-23 MED ORDER — LACTATED RINGERS IV SOLN
INTRAVENOUS | Status: DC | PRN
  Administered 2014-12-23 (×2): via INTRAVENOUS

## 2014-12-23 MED ORDER — BISACODYL 5 MG PO TBEC: 5.0000 mg | DELAYED_RELEASE_TABLET | Freq: Every day | ORAL | Status: DC | PRN

## 2014-12-23 MED ORDER — NEOSTIGMINE METHYLSULFATE 10 MG/10ML IV SOLN
INTRAVENOUS | Status: DC | PRN
  Administered 2014-12-23: 4 mg via INTRAVENOUS

## 2014-12-23 MED ORDER — ACETAMINOPHEN 325 MG PO TABS: 650.0000 mg | ORAL_TABLET | ORAL | Status: DC | PRN

## 2014-12-23 MED ORDER — ROCURONIUM BROMIDE 100 MG/10ML IV SOLN
INTRAVENOUS | Status: AC
  Filled 2014-12-23: qty 1

## 2014-12-23 MED ORDER — HYDROMORPHONE HCL 1 MG/ML IJ SOLN
0.5000 mg | INTRAMUSCULAR | Status: DC | PRN
  Administered 2014-12-24: 1 mg via INTRAVENOUS
  Filled 2014-12-23: qty 1

## 2014-12-23 MED ORDER — CEFAZOLIN SODIUM-DEXTROSE 2-3 GM-% IV SOLR
2.0000 g | INTRAVENOUS | Status: AC
  Administered 2014-12-23: 2 g via INTRAVENOUS

## 2014-12-23 MED ORDER — BUPIVACAINE-EPINEPHRINE (PF) 0.5% -1:200000 IJ SOLN
INTRAMUSCULAR | Status: AC
Start: 2014-12-23 — End: 2014-12-23
  Filled 2014-12-23: qty 30

## 2014-12-23 MED ORDER — SODIUM CHLORIDE 0.9 % IJ SOLN: 3.0000 mL | INTRAMUSCULAR | Status: DC | PRN

## 2014-12-23 MED ORDER — NEOSTIGMINE METHYLSULFATE 10 MG/10ML IV SOLN
INTRAVENOUS | Status: AC
  Filled 2014-12-23: qty 1

## 2014-12-23 MED ORDER — POLYMYXIN B SULFATE 500000 UNITS IJ SOLR
INTRAMUSCULAR | Status: AC
  Filled 2014-12-23: qty 1

## 2014-12-23 MED ORDER — CITALOPRAM HYDROBROMIDE 20 MG PO TABS
20.0000 mg | ORAL_TABLET | Freq: Every day | ORAL | Status: DC
  Administered 2014-12-23 – 2014-12-24 (×2): 20 mg via ORAL
  Filled 2014-12-23 (×2): qty 1

## 2014-12-23 MED ORDER — PHENYLEPHRINE HCL 10 MG/ML IJ SOLN
INTRAMUSCULAR | Status: DC | PRN
  Administered 2014-12-23: 80 ug via INTRAVENOUS

## 2014-12-23 MED ORDER — MEPERIDINE HCL 50 MG/ML IJ SOLN: 6.2500 mg | INTRAMUSCULAR | Status: DC | PRN

## 2014-12-23 MED ORDER — PROMETHAZINE HCL 25 MG/ML IJ SOLN: 6.2500 mg | INTRAMUSCULAR | Status: DC | PRN

## 2014-12-23 MED ORDER — KCL IN DEXTROSE-NACL 20-5-0.45 MEQ/L-%-% IV SOLN
INTRAVENOUS | Status: AC
  Administered 2014-12-23: 50 mL via INTRAVENOUS
  Filled 2014-12-23 (×2): qty 1000

## 2014-12-23 MED ORDER — PROPOFOL 10 MG/ML IV BOLUS
INTRAVENOUS | Status: DC | PRN
  Administered 2014-12-23: 180 mg via INTRAVENOUS

## 2014-12-23 MED ORDER — ONDANSETRON HCL 4 MG/2ML IJ SOLN
INTRAMUSCULAR | Status: AC
Start: 2014-12-23 — End: 2014-12-23
  Filled 2014-12-23: qty 2

## 2014-12-23 MED ORDER — CEFAZOLIN SODIUM-DEXTROSE 2-3 GM-% IV SOLR
2.0000 g | Freq: Three times a day (TID) | INTRAVENOUS | Status: AC
  Administered 2014-12-23 – 2014-12-24 (×3): 2 g via INTRAVENOUS
  Filled 2014-12-23 (×3): qty 50

## 2014-12-23 MED ORDER — ESTRADIOL 1 MG PO TABS
1.0000 mg | ORAL_TABLET | Freq: Every day | ORAL | Status: DC
  Administered 2014-12-24: 1 mg via ORAL
  Filled 2014-12-23 (×2): qty 1

## 2014-12-23 MED ORDER — MENTHOL 3 MG MT LOZG
1.0000 | LOZENGE | OROMUCOSAL | Status: DC | PRN
  Filled 2014-12-23: qty 9

## 2014-12-23 MED ORDER — BUPIVACAINE-EPINEPHRINE 0.5% -1:200000 IJ SOLN: INTRAMUSCULAR | Status: DC | PRN

## 2014-12-23 MED ORDER — FENTANYL CITRATE 0.05 MG/ML IJ SOLN
INTRAMUSCULAR | Status: DC | PRN
  Administered 2014-12-23: 50 ug via INTRAVENOUS
  Administered 2014-12-23: 150 ug via INTRAVENOUS
  Administered 2014-12-23: 50 ug via INTRAVENOUS

## 2014-12-23 MED ORDER — THROMBIN 5000 UNITS EX SOLR
CUTANEOUS | Status: AC
Start: 2014-12-23 — End: 2014-12-23
  Filled 2014-12-23: qty 10000

## 2014-12-23 MED ORDER — OXYCODONE-ACETAMINOPHEN 5-325 MG PO TABS
1.0000 | ORAL_TABLET | ORAL | Status: DC | PRN
  Administered 2014-12-23: 1 via ORAL
  Administered 2014-12-23 – 2014-12-24 (×4): 2 via ORAL
  Filled 2014-12-23: qty 1
  Filled 2014-12-23 (×4): qty 2

## 2014-12-23 SURGICAL SUPPLY — 43 items
BAG ZIPLOCK 12X15 (MISCELLANEOUS) IMPLANT
CLEANER TIP ELECTROSURG 2X2 (MISCELLANEOUS) ×2 IMPLANT
CLOTH 2% CHLOROHEXIDINE 3PK (PERSONAL CARE ITEMS) ×2 IMPLANT
DRAPE MICROSCOPE LEICA (MISCELLANEOUS) ×2 IMPLANT
DRAPE POUCH INSTRU U-SHP 10X18 (DRAPES) ×2 IMPLANT
DRAPE SURG 17X11 SM STRL (DRAPES) ×2 IMPLANT
DRAPE UTILITY XL STRL (DRAPES) ×2 IMPLANT
DRSG AQUACEL AG ADV 3.5X 4 (GAUZE/BANDAGES/DRESSINGS) ×2 IMPLANT
DRSG AQUACEL AG ADV 3.5X 6 (GAUZE/BANDAGES/DRESSINGS) IMPLANT
DURAPREP 26ML APPLICATOR (WOUND CARE) ×2 IMPLANT
DURASEAL SPINE SEALANT 3ML (MISCELLANEOUS) IMPLANT
ELECT BLADE TIP CTD 4 INCH (ELECTRODE) IMPLANT
ELECT REM PT RETURN 9FT ADLT (ELECTROSURGICAL) ×2
ELECTRODE REM PT RTRN 9FT ADLT (ELECTROSURGICAL) ×1 IMPLANT
GLOVE BIOGEL PI IND STRL 7.5 (GLOVE) ×1 IMPLANT
GLOVE BIOGEL PI INDICATOR 7.5 (GLOVE) ×1
GLOVE SURG SS PI 7.5 STRL IVOR (GLOVE) ×2 IMPLANT
GLOVE SURG SS PI 8.0 STRL IVOR (GLOVE) ×4 IMPLANT
GOWN STRL REUS W/TWL XL LVL3 (GOWN DISPOSABLE) ×4 IMPLANT
IV CATH 14GX2 1/4 (CATHETERS) IMPLANT
KIT BASIN OR (CUSTOM PROCEDURE TRAY) ×2 IMPLANT
KIT POSITIONING SURG ANDREWS (MISCELLANEOUS) ×2 IMPLANT
MANIFOLD NEPTUNE II (INSTRUMENTS) ×2 IMPLANT
NEEDLE SPNL 18GX3.5 QUINCKE PK (NEEDLE) ×4 IMPLANT
PACK LAMINECTOMY ORTHO (CUSTOM PROCEDURE TRAY) ×2 IMPLANT
PATTIES SURGICAL .5 X.5 (GAUZE/BANDAGES/DRESSINGS) IMPLANT
PATTIES SURGICAL .75X.75 (GAUZE/BANDAGES/DRESSINGS) IMPLANT
PATTIES SURGICAL 1X1 (DISPOSABLE) IMPLANT
SPONGE SURGIFOAM ABS GEL 100 (HEMOSTASIS) ×2 IMPLANT
STAPLER VISISTAT (STAPLE) IMPLANT
STRIP CLOSURE SKIN 1/2X4 (GAUZE/BANDAGES/DRESSINGS) ×2 IMPLANT
SUT NURALON 4 0 TR CR/8 (SUTURE) IMPLANT
SUT PROLENE 3 0 PS 2 (SUTURE) ×2 IMPLANT
SUT VIC AB 1 CT1 27 (SUTURE)
SUT VIC AB 1 CT1 27XBRD ANTBC (SUTURE) IMPLANT
SUT VIC AB 1-0 CT2 27 (SUTURE) ×2 IMPLANT
SUT VIC AB 2-0 CT1 27 (SUTURE)
SUT VIC AB 2-0 CT1 TAPERPNT 27 (SUTURE) IMPLANT
SUT VIC AB 2-0 CT2 27 (SUTURE) ×2 IMPLANT
SYR 3ML LL SCALE MARK (SYRINGE) IMPLANT
TOWEL OR 17X26 10 PK STRL BLUE (TOWEL DISPOSABLE) ×2 IMPLANT
TOWEL OR NON WOVEN STRL DISP B (DISPOSABLE) IMPLANT
YANKAUER SUCT BULB TIP NO VENT (SUCTIONS) IMPLANT

## 2014-12-23 NOTE — Discharge Instructions (Signed)
Walk As Tolerated utilizing back precautions.  No bending, twisting, or lifting.  No driving for 2 weeks.   °Aquacel dressing may remain in place until follow up. May shower with aquacel dressing in place. If the dressing peels off or becomes saturated, you may remove aquacel dressing and place gauze and tape dressing which should be kept clean and dry and changed daily. Do not remove steri-strips if they are present. °See Dr. Fionna Merriott in office in 10 to 14 days. Begin taking aspirin 81mg per day starting 4 days after your surgery if not allergic to aspirin or on another blood thinner. °Walk daily even outside. Use a cane or walker only if necessary. °Avoid sitting on soft sofas. ° °

## 2014-12-23 NOTE — Transfer of Care (Signed)
Immediate Anesthesia Transfer of Care Note  Patient: Carmen Rasmussen  Procedure(s) Performed: Procedure(s): MICRO LUMBAR DECOMPRESSION L5 - S1 ON THE RIGHT    (LEVEL1)     (Right)  Patient Location: PACU  Anesthesia Type:General  Level of Consciousness: awake, alert , oriented and patient cooperative  Airway & Oxygen Therapy: Patient Spontanous Breathing and Patient connected to face mask oxygen  Post-op Assessment: Report given to PACU RN, Post -op Vital signs reviewed and stable and Patient moving all extremities X 4  Post vital signs: stable  Complications: No apparent anesthesia complications

## 2014-12-23 NOTE — Interval H&P Note (Signed)
History and Physical Interval Note:  12/23/2014 7:27 AM  Carmen Rasmussen  has presented today for surgery, with the diagnosis of HNP and Stenosis L5 - S1  The various methods of treatment have been discussed with the patient and family. After consideration of risks, benefits and other options for treatment, the patient has consented to  Procedure(s): MICRO LUMBAR DECOMPRESSION L5 - S1 ON THE RIGHT POSSIBLE BILATERAL    (LEVEL1)     (Right) as a surgical intervention .  The patient's history has been reviewed, patient examined, no change in status, stable for surgery.  I have reviewed the patient's chart and labs.  Questions were answered to the patient's satisfaction.     Kenry Daubert C

## 2014-12-23 NOTE — Brief Op Note (Signed)
12/23/2014  8:46 AM  PATIENT:  Carmen Rasmussen  63 y.o. female  PRE-OPERATIVE DIAGNOSIS:  HNP and Stenosis L5 - S1  POST-OPERATIVE DIAGNOSIS:  HNP and Stenosis L5 - S1  PROCEDURE:  Procedure(s): MICRO LUMBAR DECOMPRESSION L5 - S1 ON THE RIGHT    (LEVEL1)     (Right)  SURGEON:  Surgeon(s) and Role:    * Javier DockerJeffrey C Journei Thomassen, MD - Primary  PHYSICIAN ASSISTANT:   ASSISTANTS: Bissell   ANESTHESIA:   general  EBL:  Total I/O In: 1000 [I.V.:1000] Out: -   BLOOD ADMINISTERED:none  DRAINS: none   LOCAL MEDICATIONS USED:  MARCAINE     SPECIMEN:  No Specimen  DISPOSITION OF SPECIMEN:  N/A  COUNTS:  YES  TOURNIQUET:  * No tourniquets in log *  DICTATION: .Other Dictation: Dictation Number [id number]  PLAN OF CARE: Admit for overnight observation  PATIENT DISPOSITION:  PACU - hemodynamically stable.   Delay start of Pharmacological VTE agent (>24hrs) due to surgical blood loss or risk of bleeding: yes

## 2014-12-23 NOTE — Op Note (Signed)
NAMRowland Lathe:  Bernick, Yolandra                ACCOUNT NO.:  [id number]  MEDICAL RECORD NO.:  [id number]  LOCATION:  [facility]                         [facility]  PHYSICIAN:  Jene EveryJeffrey Keath Matera, M.D.    DATE OF BIRTH:  1951/12/04  DATE OF PROCEDURE:  12/23/2014 DATE OF DISCHARGE:                              OPERATIVE REPORT   PREOPERATIVE DIAGNOSES:  Spinal stenosis; herniated nucleus pulposus, L5- S1, right.  POSTOPERATIVE DIAGNOSES:  Spinal stenosis; herniated nucleus pulposus, L5-S1, right.  PROCEDURE PERFORMED:  Microlumbar decompression, L5-S1, right. Foraminotomy, L5-S1, right.  ANESTHESIA:  General.  ASSISTANT:  Andrez GrimeJaclyn Bissell, PA  HISTORY:  A 63 year old with HNP, disk herniation, indicated for lumbar decompression, L5-S1.  __________ conservative treatment.  Risk and benefits discussed including bleeding, infection, damage to neurovascular structures, DVT, PE, anesthetic complications, etc.  TECHNIQUE:  With the patient in supine position, after induction of adequate general anesthesia, placed prone on the [place] frame.  All bony prominences were well padded.  Lumbar region was prepped and draped in usual sterile fashion.  An 18-gauge spinal needle was utilized to localize L5-S1 interspace, confirmed with x-ray.  An incision was made from spinous process of L5-S1.  Subcutaneous tissue was dissected. Electrocautery was utilized to achieve hemostasis.  Dorsolumbar fascia was identified and divided in line with skin incision.  Paraspinous muscle elevated from lamina, L5-S1.  Operating microscope was draped, brought into the surgical field.  Paraspinous muscle elevated from L5 and S1.  Confirmatory radiograph obtained.  A curette was utilized to detach the ligamentum flavum from the cephalad edge of S1. Hemilaminotomy of the caudad edge of L5 was performed with 3-mm Kerrison, detached the ligamentum flavum.  Neuro Patties placed beneath the ligamentum flavum.  We removed the  ligamentum flavum.  Hypertrophic facet ligamentum flavum was noted, __________ S1 nerve root, performed a generous foraminotomy of S1.  Identified the S1 nerve root, mobilized it medially.  There was an extensive epidural venous plexus noted.  This was cauterized and divided.  This was __________ L5 root.  Using bipolar electrocautery, the patient had twitches.  There was no retraction. This was confirmed as vascular.  Identified a small disk herniation and performed an annulotomy.  We removed the disk herniation and disk material was removed from the disk space with a micropituitary.  Further mobilized with a nerve hook.  Foraminotomy of L5 was performed.  I decompressed the lateral recess to the medial border of the pedicle. Following decompression, a neural probe passed freely up the foramen of S1 and L5.  There was no residual disk herniation.  We checked beneath thecal sac, both foramen, shoulder, and the axilla of the root.  No neural compression noted.  Copiously irrigated disk space with antibiotic irrigation.  Inspection revealed no CSF leakage or active bleeding.  Placed thrombin-soaked Gelfoam in the laminotomy defect.  We removed the [facility] retractor, irrigated the paraspinous musculature. Closed the fascia with 1 Vicryl, subcu with 2-0, skin with Prolene. Sterile dressing applied.  Placed supine on hospital bed, extubated without difficulty, and transported to the recovery room in satisfactory condition.  The patient tolerated the procedure well.  No complications.  Assistant, Andrez GrimeJaclyn Bissell, [place].  Minimal blood loss.     Jene EveryJeffrey Draiden Mirsky, M.D.     Cordelia PenJB/MEDQ  D:  12/23/2014  T:  12/23/2014  Job:  [id number]

## 2014-12-23 NOTE — Anesthesia Postprocedure Evaluation (Signed)
  Anesthesia Post-op Note  Patient: Carmen Rasmussen  Procedure(s) Performed: Procedure(s) (LRB): MICRO LUMBAR DECOMPRESSION L5 - S1 ON THE RIGHT    (LEVEL1)     (Right)  Patient Location: PACU  Anesthesia Type: General  Level of Consciousness: awake and alert   Airway and Oxygen Therapy: Patient Spontanous Breathing  Post-op Pain: mild  Post-op Assessment: Post-op Vital signs reviewed, Patient's Cardiovascular Status Stable, Respiratory Function Stable, Patent Airway and No signs of Nausea or vomiting  Last Vitals:  Filed Vitals:   12/23/14 1352  BP: 120/67  Pulse: 85  Temp: 36.6 C  Resp: 16    Post-op Vital Signs: stable   Complications: No apparent anesthesia complications

## 2014-12-23 NOTE — H&P (Signed)
Carmen Rasmussen is an 63 y.o. female.  Chief Complaint: back and B/L leg pain HPI: The patient is a 63 year old female who presents today for follow up of their back. The patient is being followed for their back pain. They are now 3 1/2 months out from when symptoms began. Symptoms reported today include: pain, numbness (legs), burning (feet), leg pain (bilateral upper legs) and pain with standing. The patient states that they are doing poorly. The following medication has been used for pain control: Percocet (prn) and Norco and Robaxin. The patient reports their current pain level to be severe. The patient presents today following MRI (11/13/2014).  Carmen Rasmussen follows up. She has pain in the left greater than right. She had the MRI of her lumbar spine indicating no evidence of a lesion compressing the cauda equina. She does have significant bilateral lateral recessed stenosis. Indicated the disc protrusion is no longer present, however it is present on the T2 image on 18/23 as well as hypertrophic ligamentum, 3-4 and 4-5 is okay. She has a disc herniation at 2-3 to the left, but has no anterior thigh pain.  Past Medical History  Diagnosis Date  . Carpal tunnel syndrome   . Edema   . History of shingles   . Tachycardia   . Hyperextension injury of cervical spine   . Hypertension   . Abnormal Pap smear   . Ruptured lumbar disc     Past Surgical History  Procedure Laterality Date  . Cesarean section      Family History  Problem Relation Age of Onset  . Asthma Mother   . Thyroid disease Mother   . Diabetes Mother   . Stroke Mother   . Diabetes Father   . Colon cancer Sister   . Hypertension Brother   . Thyroid disease Sister   . Asthma Sister   . Breast cancer Sister   . Colon cancer Sister   . Colon cancer Maternal Grandmother   . Colon cancer Paternal Aunt     throat  .  Colon cancer Paternal Uncle     stomach  . Colon cancer Paternal Aunt     kidney   Social History:  reports that she has never smoked. She has never used smokeless tobacco. She reports that she does not drink alcohol or use illicit drugs.  Allergies:  Allergies  Allergen Reactions  . Meloxicam Swelling and Rash     (Not in a hospital admission)   Lab Results Last 48 Hours    No results found for this or any previous visit (from the past 48 hour(s)).    Imaging Results (Last 48 hours)    No results found.    Review of Systems  Constitutional: Negative.  HENT: Negative.  Eyes: Negative.  Respiratory: Negative.  Cardiovascular: Negative.  Gastrointestinal: Negative.  Genitourinary: Negative.  Musculoskeletal: Positive for back pain.  Skin: Negative.  Neurological: Positive for sensory change and focal weakness.  Psychiatric/Behavioral: Negative.    There were no vitals taken for this visit. Physical Exam  Musculoskeletal:  On exam, she reports her legs get heavy with long walking and straight leg raise with buttock, thigh, and calf pain on the right, buttock and thigh pain on the left. EHL weakness is noted. Pain with extension.  Lumbar spine exam reveals no evidence of soft tissue swelling, deformity or skin ecchymosis. On palpation there is no tenderness of the lumbar spine. No flank pain with percussion. The abdomen is soft  and nontender. Nontender over the trochanters. No cellulitis or lymphadenopathy.  Motor is 5/5 including tibialis anterior, plantar flexion, quadriceps and hamstrings. Patient is normoreflexic. There is no Babinski or clonus. Sensory exam is intact to light touch. Patient has good distal pulses. No DVT. No pain and normal range of motion without instability of the hips, knees and ankles.     Assessment/Plan 1. Bilateral S1 radiculopathy secondary to lateral recessed stenosis, disc herniation 5-1 to the right.  2.  Disc degeneration 3-4 and 4-5. No evidence of cauda equina lesion.  Neurologic exam, his cranial sensation is intact. No neurocompressive lesion. We discussed having to see her neurologist. No burning dysuria. Refilled her medicines. She is scheduled for a decompression on the right. I do feel given her symptomatology, it would be appropriate to consider decompression bilaterally at 5-1. If she has any changes in the interim, she is to call.  We discussed pathology, relevant anatomy, and treatment options. She has certainly exhausted conservative treatment with epidural, physical therapy, activity modifications in the presence of a neurocompressive lesion, straight leg raise, and EHL weakness, plantar flexion weakness. We discussed option, which would be lumbar decompression and she would like to proceed with that. Also, though, this would not address back pain related to her multilevel disc degeneration, and may require fusion in the future and she understands. We will proceed accordingly.  I had an extensive discussion of the risks and benefits of the lumbar decompression with the patient including bleeding, infection, damage to neurovascular structures, epidural fibrosis, CSF leak requiring repair. We also discussed increase in pain, adjacent segment disease, recurrent disc herniation, need for future surgery including repeat decompression and/or fusion. We also discussed risks of postoperative hematoma, paralysis, anesthetic complications including DVT, PE, death, cardiopulmonary dysfunction. In addition, the perioperative and postoperative courses were discussed in detail including the rehabilitative time and return to functional activity and work. I provided the patient with an illustrated handout and utilized the appropriate surgical models.  Plan microlumbar decompression L5-S1 right, possible bilateral  BISSELL, Carmen Rasmussen for Dr. Shelle IronBeane

## 2014-12-23 NOTE — H&P (View-Only) (Signed)
Carmen Rasmussen is an 63 y.o. female.   Chief Complaint: back and B/L leg pain HPI: The patient is a 63 year old female who presents today for follow up of their back. The patient is being followed for their back pain. They are now 3 1/2 months out from when symptoms began. Symptoms reported today include: pain, numbness (legs), burning (feet), leg pain (bilateral upper legs) and pain with standing. The patient states that they are doing poorly. The following medication has been used for pain control: Percocet (prn) and Norco and Robaxin. The patient reports their current pain level to be severe. The patient presents today following MRI (11/13/2014).  Carmen Rasmussen follows up. She has pain in the left greater than right. She had the MRI of her lumbar spine indicating no evidence of a lesion compressing the cauda equina. She does have significant bilateral lateral recessed stenosis. Indicated the disc protrusion is no longer present, however it is present on the T2 image on 18/23 as well as hypertrophic ligamentum, 3-4 and 4-5 is okay. She has a disc herniation at 2-3 to the left, but has no anterior thigh pain.  Past Medical History  Diagnosis Date  . Carpal tunnel syndrome   . Edema   . History of shingles   . Tachycardia   . Hyperextension injury of cervical spine   . Hypertension   . Abnormal Pap smear   . Ruptured lumbar disc     Past Surgical History  Procedure Laterality Date  . Cesarean section      Family History  Problem Relation Age of Onset  . Asthma Mother   . Thyroid disease Mother   . Diabetes Mother   . Stroke Mother   . Diabetes Father   . Colon cancer Sister   . Hypertension Brother   . Thyroid disease Sister   . Asthma Sister   . Breast cancer Sister   . Colon cancer Sister   . Colon cancer Maternal Grandmother   . Colon cancer Paternal Aunt     throat  . Colon cancer Paternal Uncle     stomach  . Colon cancer Paternal Aunt     kidney   Social History:   reports that she has never smoked. She has never used smokeless tobacco. She reports that she does not drink alcohol or use illicit drugs.  Allergies:  Allergies  Allergen Reactions  . Meloxicam Swelling and Rash     (Not in a hospital admission)  No results found for this or any previous visit (from the past 48 hour(s)). No results found.  Review of Systems  Constitutional: Negative.   HENT: Negative.   Eyes: Negative.   Respiratory: Negative.   Cardiovascular: Negative.   Gastrointestinal: Negative.   Genitourinary: Negative.   Musculoskeletal: Positive for back pain.  Skin: Negative.   Neurological: Positive for sensory change and focal weakness.  Psychiatric/Behavioral: Negative.     There were no vitals taken for this visit. Physical Exam  Musculoskeletal:  On exam, she reports her legs get heavy with long walking and straight leg raise with buttock, thigh, and calf pain on the right, buttock and thigh pain on the left. EHL weakness is noted. Pain with extension.  Lumbar spine exam reveals no evidence of soft tissue swelling, deformity or skin ecchymosis. On palpation there is no tenderness of the lumbar spine. No flank pain with percussion. The abdomen is soft and nontender. Nontender over the trochanters. No cellulitis or lymphadenopathy.  Motor   is 5/5 including tibialis anterior, plantar flexion, quadriceps and hamstrings. Patient is normoreflexic. There is no Babinski or clonus. Sensory exam is intact to light touch. Patient has good distal pulses. No DVT. No pain and normal range of motion without instability of the hips, knees and ankles.     Assessment/Plan 1. Bilateral S1 radiculopathy secondary to lateral recessed stenosis, disc herniation 5-1 to the right.  2. Disc degeneration 3-4 and 4-5. No evidence of cauda equina lesion.  Neurologic exam, his cranial sensation is intact. No neurocompressive lesion. We discussed having to see her neurologist. No  burning dysuria. Refilled her medicines. She is scheduled for a decompression on the right. I do feel given her symptomatology, it would be appropriate to consider decompression bilaterally at 5-1. If she has any changes in the interim, she is to call.  We discussed pathology, relevant anatomy, and treatment options. She has certainly exhausted conservative treatment with epidural, physical therapy, activity modifications in the presence of a neurocompressive lesion, straight leg raise, and EHL weakness, plantar flexion weakness. We discussed option, which would be lumbar decompression and she would like to proceed with that. Also, though, this would not address back pain related to her multilevel disc degeneration, and may require fusion in the future and she understands. We will proceed accordingly.  I had an extensive discussion of the risks and benefits of the lumbar decompression with the patient including bleeding, infection, damage to neurovascular structures, epidural fibrosis, CSF leak requiring repair. We also discussed increase in pain, adjacent segment disease, recurrent disc herniation, need for future surgery including repeat decompression and/or fusion. We also discussed risks of postoperative hematoma, paralysis, anesthetic complications including DVT, PE, death, cardiopulmonary dysfunction. In addition, the perioperative and postoperative courses were discussed in detail including the rehabilitative time and return to functional activity and work. I provided the patient with an illustrated handout and utilized the appropriate surgical models.  Plan microlumbar decompression L5-S1 right, possible bilateral  Carmen Rasmussen M.  PA-C for Dr. Shelle IronBeane  12/01/2014, 1:05 PM

## 2014-12-23 NOTE — Anesthesia Preprocedure Evaluation (Addendum)
Anesthesia Evaluation  Patient identified by MRN, date of birth, ID band Patient awake    Reviewed: Allergy & Precautions, H&P , NPO status , Patient's Chart, lab work & pertinent test results  Airway Mallampati: II  TM Distance: >3 FB Neck ROM: Full    Dental no notable dental hx. (+) Chipped, Loose, Caps,    Pulmonary neg pulmonary ROS,  breath sounds clear to auscultation  Pulmonary exam normal       Cardiovascular hypertension, Rhythm:Regular Rate:Normal     Neuro/Psych negative neurological ROS  negative psych ROS   GI/Hepatic negative GI ROS, Neg liver ROS,   Endo/Other  negative endocrine ROS  Renal/GU negative Renal ROS  negative genitourinary   Musculoskeletal C-spine issues   Abdominal   Peds negative pediatric ROS (+)  Hematology negative hematology ROS (+)   Anesthesia Other Findings   Reproductive/Obstetrics negative OB ROS                            Anesthesia Physical Anesthesia Plan  ASA: II  Anesthesia Plan: General   Post-op Pain Management:    Induction: Intravenous  Airway Management Planned: Oral ETT and Video Laryngoscope Planned  Additional Equipment:   Intra-op Plan:   Post-operative Plan: Extubation in OR  Informed Consent: I have reviewed the patients History and Physical, chart, labs and discussed the procedure including the risks, benefits and alternatives for the proposed anesthesia with the patient or authorized representative who has indicated his/her understanding and acceptance.   Dental advisory given  Plan Discussed with: CRNA  Anesthesia Plan Comments:        Anesthesia Quick Evaluation

## 2014-12-24 ENCOUNTER — Encounter (HOSPITAL_COMMUNITY): Payer: Self-pay | Admitting: Specialist

## 2014-12-24 DIAGNOSIS — M4806 Spinal stenosis, lumbar region: Secondary | ICD-10-CM | POA: Diagnosis not present

## 2014-12-24 NOTE — Progress Notes (Signed)
Subjective: 1 Day Post-Op Procedure(s) (LRB): MICRO LUMBAR DECOMPRESSION L5 - S1 ON THE RIGHT    (LEVEL1)     (Right) Patient reports pain as 3 on 0-10 scale.   No leg pain  Objective: Vital signs in last 24 hours: Temp:  [97.6 F (36.4 C)-98.8 F (37.1 C)] 98.7 F (37.1 C) (12/31 0511) Pulse Rate:  [60-85] 69 (12/31 0511) Resp:  [12-16] 16 (12/31 0511) BP: (114-130)/(54-70) 117/62 mmHg (12/31 0511) SpO2:  [97 %-100 %] 97 % (12/31 0511) Weight:  [66.225 kg (146 lb)] 66.225 kg (146 lb) (12/30 1030)  Intake/Output from previous day: 12/30 0701 - 12/31 0700 In: 3240 [P.O.:840; I.V.:2400] Out: 300 [Urine:300] Intake/Output this shift: Total I/O In: 240 [P.O.:240] Out: -   No results for input(s): HGB in the last 72 hours. No results for input(s): WBC, RBC, HCT, PLT in the last 72 hours. No results for input(s): NA, K, CL, CO2, BUN, CREATININE, GLUCOSE, CALCIUM in the last 72 hours. No results for input(s): LABPT, INR in the last 72 hours.  Neurologically intact Sensation intact distally Intact pulses distally Incision: dressing C/D/I  Assessment/Plan: 1 Day Post-Op Procedure(s) (LRB): MICRO LUMBAR DECOMPRESSION L5 - S1 ON THE RIGHT    (LEVEL1)     (Right) Advance diet D/C IV fluids Discharge home with home health  Discussed with patient.  Carmen Rasmussen C 12/24/2014, 10:03 AM

## 2014-12-24 NOTE — Progress Notes (Signed)
CSW consulted for SNF placement. PN reviewed. PT / OT are not recommending follow up therapy. Pt is planning to return home today. CSW signing off.  Cori RazorJamie Keshana Klemz LCSW [phone]

## 2014-12-24 NOTE — Care Management Note (Signed)
    Page 1 of 1   12/24/2014     9:41:27 AM CARE MANAGEMENT NOTE 12/24/2014  Patient:  Carmen Rasmussen,[name]   Account Number:  [id number]  Date Initiated:  12/24/2014  Documentation initiated by:  GlenbeighJEFFRIES,Taressa Rauh  Subjective/Objective Assessment:   adm: MICRO LUMBAR DECOMPRESSION L5 - S1 ON THE RIGHT    (LEVEL1)     (Right)     Action/Plan:   discharge planning   Anticipated DC Date:  12/24/2014   Anticipated DC Plan:  HOME/SELF CARE      DC Planning Services  CM consult  Medication Assistance      Choice offered to / List presented to:             Status of service:  Completed, signed off Medicare Important Message given?   (If response is "NO", the following Medicare IM given date fields will be blank) Date Medicare IM given:   Medicare IM given by:   Date Additional Medicare IM given:   Additional Medicare IM given by:    Discharge Disposition:  HOME/SELF CARE  Per UR Regulation:    If discussed at Long Length of Stay Meetings, dates discussed:    Comments:  12/24/14 08:00 CM notes pt has [employer] insurance and unfortunately is not elligible for Medication assistance(MATCH)  per CM consult.  Will continue to monitor for disposition needs.  Carmen Rasmussen, BSN, CM [phone].

## 2014-12-24 NOTE — Discharge Summary (Signed)
Physician Discharge Summary   Patient ID: Carmen Rasmussen MRN: [id number] DOB/AGE: 07-12-1951 63 y.o.  Admit date: 12/23/2014 Discharge date: 12/24/2014  Primary Diagnosis:   HNP and Stenosis L5 - S1  Admission Diagnoses:  Past Medical History  Diagnosis Date  . Carpal tunnel syndrome   . Edema   . History of shingles     in 5th grade  . Tachycardia   . Hyperextension injury of cervical spine   . Abnormal Pap smear   . Ruptured lumbar disc   . Anxiety   . Arthritis   . GERD (gastroesophageal reflux disease)   . Hypertension     hx of hypetension currently not treated with medication pt has been on metoprolol in past used for essential tremors currently not on medication    Discharge Diagnoses:   Principal Problem:   HNP (herniated nucleus pulposus), lumbar  Procedure:  Procedure(s) (LRB): MICRO LUMBAR DECOMPRESSION L5 - S1 ON THE RIGHT    (LEVEL1)     (Right)   Consults: None  HPI:  see H&P    Laboratory Data: Hospital Outpatient Visit on 12/14/2014  Component Date Value Ref Range Status  . MRSA, PCR 12/14/2014 NEGATIVE  NEGATIVE Final  . Staphylococcus aureus 12/14/2014 NEGATIVE  NEGATIVE Final   Comment:        The Xpert SA Assay (FDA approved for NASAL specimens in patients over 55 years of age), is one component of a comprehensive surveillance program.  Test performance has been validated by [employer] for patients greater than or equal to 62 year old. It is not intended to diagnose infection nor to guide or monitor treatment.   . Sodium 12/14/2014 139  137 - 147 mEq/L Final  . Potassium 12/14/2014 5.2  3.7 - 5.3 mEq/L Final  . Chloride 12/14/2014 100  96 - 112 mEq/L Final  . CO2 12/14/2014 27  19 - 32 mEq/L Final  . Glucose, Bld 12/14/2014 95  70 - 99 mg/dL Final  . BUN 12/14/2014 13  6 - 23 mg/dL Final  . Creatinine, Ser 12/14/2014 1.04  0.50 - 1.10 mg/dL Final  . Calcium 12/14/2014 10.0  8.4 - 10.5 mg/dL Final  . GFR calc non Af Amer  12/14/2014 57* >90 mL/min Final  . GFR calc Af Amer 12/14/2014 66* >90 mL/min Final   Comment: (NOTE) The eGFR has been calculated using the CKD EPI equation. This calculation has not been validated in all clinical situations. eGFR's persistently <90 mL/min signify possible Chronic Kidney Disease.   . Anion gap 12/14/2014 12  5 - 15 Final  . WBC 12/14/2014 6.4  4.0 - 10.5 K/uL Final  . RBC 12/14/2014 4.22  3.87 - 5.11 MIL/uL Final  . Hemoglobin 12/14/2014 13.6  12.0 - 15.0 g/dL Final  . HCT 12/14/2014 41.1  36.0 - 46.0 % Final  . MCV 12/14/2014 97.4  78.0 - 100.0 fL Final  . MCH 12/14/2014 32.2  26.0 - 34.0 pg Final  . MCHC 12/14/2014 33.1  30.0 - 36.0 g/dL Final  . RDW 12/14/2014 12.3  11.5 - 15.5 % Final  . Platelets 12/14/2014 248  150 - 400 K/uL Final   No results for input(s): HGB in the last 72 hours. No results for input(s): WBC, RBC, HCT, PLT in the last 72 hours. No results for input(s): NA, K, CL, CO2, BUN, CREATININE, GLUCOSE, CALCIUM in the last 72 hours. No results for input(s): LABPT, INR in the last 72 hours.  X-Rays:Dg Chest 2  View  12/14/2014   CLINICAL DATA:  63 year old female preoperative study for lumbar surgery. Primary hypertension. Initial encounter.  EXAM: CHEST  2 VIEW  COMPARISON:  Chest radiograph 06/05/2010.  FINDINGS: Stable and normal lung volumes. Normal cardiac size and mediastinal contours. Lung parenchyma stable and clear. No pneumothorax or effusion. No acute osseous abnormality identified.  IMPRESSION: Negative, no acute cardiopulmonary abnormality.   Electronically Signed   By: Lars Pinks M.D.   On: 12/14/2014 15:11   Dg Spine Portable 1 View  12/23/2014   CLINICAL DATA:  Lumbar decompression L5-S1  EXAM: PORTABLE SPINE - 1 VIEW  COMPARISON:  Study obtained earlier in the day  FINDINGS: This cross-table lumbar spine images labeled image 3. There is a metallic probe with the tip overlying the posterior aspect of the L5-S1 disc level. No fracture or  spondylolisthesis. There is moderate disc space narrowing at L1-2, L2-3, and L3-4.  IMPRESSION: Metallic probe tip overlies the posterior aspect of the L5-S1 disc level.   Electronically Signed   By: Lowella Grip M.D.   On: 12/23/2014 08:45   Dg Spine Portable 1 View  12/23/2014   CLINICAL DATA:  Lumbar decompression of L5-S1.  EXAM: PORTABLE SPINE - 1 VIEW  COMPARISON:  Same day.  FINDINGS: Single lateral cross-table intraoperative radiograph of the lumbar spine was obtained. This demonstrates surgical probe inferior to the L5 spinous process with tip directed toward the L5-S1 disc space.  IMPRESSION: Surgical localization as described above.   Electronically Signed   By: Sabino Dick M.D.   On: 12/23/2014 08:34   Dg Spine Portable 1 View  12/23/2014   CLINICAL DATA:  Intraoperative film lump. Lumbar decompression L5-S1.  EXAM: PORTABLE SPINE - 1 VIEW  COMPARISON:  12/31/2013  FINDINGS: Posterior instrument is directed at the L5-S1 level.  IMPRESSION: Intraoperative localization of L5-S1 level.   Electronically Signed   By: Rolm Baptise M.D.   On: 12/23/2014 08:17    EKG: Orders placed or performed during the hospital encounter of 12/14/14  . EKG 12-Lead  . EKG 12-Lead     Hospital Course: Patient was admitted to [facility] and taken to the OR and underwent the above state procedure without complications.  Patient tolerated the procedure well and was later transferred to the recovery room and then to the orthopaedic floor for postoperative care.  They were given PO and IV analgesics for pain control following their surgery.  They were given 24 hours of postoperative antibiotics.   PT was consulted postop to assist with mobility and transfers.  The patient was allowed to be WBAT with therapy and was taught back precautions. Discharge planning was consulted to help with postop disposition and equipment needs.  Patient had a good night on the evening of surgery and started to get up OOB  with therapy on day one. Patient was seen in rounds and was ready to go home on day one.  They were given discharge instructions and dressing directions.  They were instructed on when to follow up in the office with Dr. Tonita Cong.   Diet: Regular diet Activity:WBAT; Lspine precautions Follow-up:in 10-14 days Disposition - Home Discharged Condition: good   Discharge Instructions    Call MD / Call 911    Complete by:  As directed   If you experience chest pain or shortness of breath, CALL 911 and be transported to the hospital emergency room.  If you develope a fever above 101 F, pus (white drainage) or  increased drainage or redness at the wound, or calf pain, call your surgeon's office.     Constipation Prevention    Complete by:  As directed   Drink plenty of fluids.  Prune juice may be helpful.  You may use a stool softener, such as Colace (over the counter) 100 mg twice a day.  Use MiraLax (over the counter) for constipation as needed.     Diet - low sodium heart healthy    Complete by:  As directed      Increase activity slowly as tolerated    Complete by:  As directed             Medication List    STOP taking these medications        aspirin 81 MG tablet     HYDROcodone-acetaminophen 10-325 MG per tablet  Commonly known as:  NORCO     ibuprofen 800 MG tablet  Commonly known as:  ADVIL,MOTRIN     oxyCODONE-acetaminophen 10-325 MG per tablet  Commonly known as:  PERCOCET  Replaced by:  oxyCODONE-acetaminophen 5-325 MG per tablet      TAKE these medications        citalopram 20 MG tablet  Commonly known as:  CELEXA  Take 20 mg by mouth daily.     clonazePAM 1 MG tablet  Commonly known as:  KLONOPIN  Take 1 mg by mouth 2 (two) times daily as needed for anxiety.     docusate sodium 100 MG capsule  Commonly known as:  COLACE  Take 1 capsule (100 mg total) by mouth 2 (two) times daily as needed for mild constipation.     estradiol 1 MG tablet  Commonly known as:   ESTRACE  Take 1 tablet (1 mg total) by mouth daily.     medroxyPROGESTERone 2.5 MG tablet  Commonly known as:  PROVERA  Take 2.5 mg by mouth daily.     methocarbamol 500 MG tablet  Commonly known as:  ROBAXIN  Take 1 tablet (500 mg total) by mouth 3 (three) times daily between meals as needed for muscle spasms.     oxyCODONE-acetaminophen 5-325 MG per tablet  Commonly known as:  PERCOCET  Take 1 tablet by mouth every 4 (four) hours as needed.           Follow-up Information    Follow up with BEANE,JEFFREY C, MD In 2 weeks.   Specialty:  Orthopedic Surgery   Why:  For suture removal   Contact information:   [address] [place] [address] [phone]       Signed: Lacie Draft, PA-C Orthopaedic Surgery 12/24/2014, 10:09 AM

## 2014-12-24 NOTE — Evaluation (Signed)
Occupational Therapy Evaluation Patient Details Name: Carmen Rasmussen MRN: [id number] DOB: July 13, 1951 Today's Date: 12/24/2014    History of Present Illness Pt is a 63 year old female s/p micro lumbar decompression L5-S1 on right.     Clinical Impression   Pt doing well. Overall at supervision level with ADL. Has sister who will be checking on pt intermittently as well as pt's daughter. All education completed. Sign off.    Follow Up Recommendations  No OT follow up;Supervision - Intermittent    Equipment Recommendations  None recommended by OT    Recommendations for Other Services       Precautions / Restrictions Precautions Precautions: Back Precaution Comments: Reviewed back precautions with pt Restrictions Weight Bearing Restrictions: No      Mobility Bed Mobility          Transfers Overall transfer level: Needs assistance Equipment used: None Transfers: Sit to/from Stand Sit to Stand: Supervision         General transfer comment: verbal cues for back precautions    Balance                                            ADL Overall ADL's : Needs assistance/impaired Eating/Feeding: Independent;Sitting   Grooming: Wash/dry hands;Supervision/safety;Standing   Upper Body Bathing: Set up;Sitting   Lower Body Bathing: Supervison/ safety;Sit to/from stand   Upper Body Dressing : Set up;Sitting   Lower Body Dressing: Supervision/safety;Sit to/from stand   Toilet Transfer: Supervision/safety;Ambulation;Comfort height toilet;Grab bars   Toileting- Clothing Manipulation and Hygiene: Supervision/safety;Sitting/lateral lean   Tub/ Shower Transfer: Supervision/safety;Tub transfer     General ADL Comments: Reviewed all back precautions with pt and emphasized twisting as pt tends to want to look around behind her to person speaking, etc. Reviewed handout also with pt and sister. Pt does well descending to comfort height commode and feel she  will be fine with standard commode as she has a vanity to stabilize with also. Educated on not twisting for toilet hygiene and suggested wet wipes to make it easier.  Pt states she has access to a reacher and plans to obtain a LHS after education on this piece of AE. Explained and demo how she can use a Sports administratorreacher.      Vision                     Perception     Praxis      Pertinent Vitals/Pain Pain Assessment: 0-10 Pain Score: 6  Pain Location: back Pain Descriptors / Indicators: Aching;Sore Pain Intervention(s): Limited activity within patient's tolerance;Monitored during session (not yet due for pain meds, RN aware)     Hand Dominance     Extremity/Trunk Assessment Upper Extremity Assessment Upper Extremity Assessment: Overall WFL for tasks assessed          Communication Communication Communication: No difficulties   Cognition Arousal/Alertness: Awake/alert Behavior During Therapy: WFL for tasks assessed/performed Overall Cognitive Status: Within Functional Limits for tasks assessed                     General Comments       Exercises       Shoulder Instructions      Home Living Family/patient expects to be discharged to:: Private residence Living Arrangements: Alone   Type of Home: House Home Access: Level entry  Home Layout: Able to live on main level with bedroom/bathroom     Bathroom Shower/Tub: Chief Strategy OfficerTub/shower unit   Bathroom Toilet: Standard     Home Equipment: MudloggerAdaptive equipment Adaptive Equipment: Reacher        Prior Functioning/Environment Level of Independence: Independent             OT Diagnosis: Generalized weakness   OT Problem List:     OT Treatment/Interventions:      OT Goals(Current goals can be found in the care plan section) Acute Rehab OT Goals Patient Stated Goal: home OT Goal Formulation: With patient  OT Frequency:     Barriers to D/C:            Co-evaluation              End of  Session    Activity Tolerance: Patient tolerated treatment well Patient left: with call bell/phone within reach   Time: 1030-1050 OT Time Calculation (min): 20 min Charges:  OT General Charges $OT Visit: 1 Procedure OT Evaluation $Initial OT Evaluation Tier I: 1 Procedure OT Treatments $Therapeutic Activity: 8-22 mins G-Codes: OT G-codes **NOT FOR INPATIENT CLASS** Functional Assessment Tool Used: clinical judgement Functional Limitation: Self care Self Care Current Status (X3244(G8987): At least 1 percent but less than 20 percent impaired, limited or restricted Self Care Goal Status (W1027(G8988): At least 1 percent but less than 20 percent impaired, limited or restricted Self Care Discharge Status [phone](G8989): At least 1 percent but less than 20 percent impaired, limited or restricted  Lennox LaityStone, Kirsti Mcalpine Stafford  [phone] 12/24/2014, 11:05 AM

## 2014-12-24 NOTE — Progress Notes (Signed)
RN reviewed discharge instructions with patient and family. All questions answered.  Paperwork and prescriptions given.   NT rolled patient down in wheelchair to family car.  

## 2014-12-24 NOTE — Evaluation (Signed)
Physical Therapy Evaluation Patient Details Name: Carmen Rasmussen G Makin MRN: [id number] DOB: [birth year] Today's Date: 12/24/2014   History of Present Illness  Pt is a 63 year old female s/p micro lumbar decompression L5-S1 on right.    Clinical Impression  Patient evaluated by Physical Therapy with no further acute PT needs identified. All education has been completed and the patient has no further questions.  Pt mobilizing well, educated on back precautions with handout provided, and pt had no further questions.  See below for any follow-up Physial Therapy or equipment needs. PT is signing off. Thank you for this referral.       Follow Up Recommendations No PT follow up    Equipment Recommendations  None recommended by PT    Recommendations for Other Services       Precautions / Restrictions Precautions Precautions: Back Precaution Comments: Reviewed back precautions with pt Restrictions Weight Bearing Restrictions: No      Mobility  Bed Mobility Overal bed mobility: Needs Assistance Bed Mobility: Supine to Sit     Supine to sit: Supervision     General bed mobility comments: verbal cues for log roll technique  Transfers Overall transfer level: Needs assistance Equipment used: None Transfers: Sit to/from Stand Sit to Stand: Supervision;Modified independent (Device/Increase time)         General transfer comment: verbal cues initially for maintaining back precautions  Ambulation/Gait Ambulation/Gait assistance: Supervision;Modified independent (Device/Increase time) Ambulation Distance (Feet): 260 Feet Assistive device: None Gait Pattern/deviations: Step-through pattern Gait velocity: decr   General Gait Details: slower pace but steady, reviewed back precautions with activity during ambulation  Stairs            Wheelchair Mobility    Modified Rankin (Stroke Patients Only)       Balance                                              Pertinent Vitals/Pain Pain Assessment: 0-10 Pain Score: 6  Pain Location: back Pain Descriptors / Indicators: Aching;Sore Pain Intervention(s): Limited activity within patient's tolerance;Monitored during session (not yet due for pain meds, RN aware)    Home Living Family/patient expects to be discharged to:: Private residence Living Arrangements: Alone   Type of Home: House Home Access: Level entry     Home Layout: Able to live on main level with bedroom/bathroom Home Equipment: Adaptive equipment      Prior Function Level of Independence: Independent               Hand Dominance        Extremity/Trunk Assessment               Lower Extremity Assessment: Overall WFL for tasks assessed (denies LE symptoms)         Communication   Communication: No difficulties  Cognition Arousal/Alertness: Awake/alert Behavior During Therapy: WFL for tasks assessed/performed Overall Cognitive Status: Within Functional Limits for tasks assessed                      General Comments      Exercises        Assessment/Plan    PT Assessment Patent does not need any further PT services  PT Diagnosis     PT Problem List    PT Treatment Interventions     PT Goals (Current goals can  be found in the Care Plan section) Acute Rehab PT Goals PT Goal Formulation: All assessment and education complete, DC therapy    Frequency     Barriers to discharge        Co-evaluation               End of Session   Activity Tolerance: Patient tolerated treatment well Patient left: in bed;with call bell/phone within reach      Functional Assessment Tool Used: clinical judgement Functional Limitation: Mobility: Walking and moving around Mobility: Walking and Moving Around Current Status (Z6109(G8978): At least 1 percent but less than 20 percent impaired, limited or restricted Mobility: Walking and Moving Around Goal Status [phone](G8979): 0 percent impaired, limited  or restricted Mobility: Walking and Moving Around Discharge Status [phone](G8980): 0 percent impaired, limited or restricted    Time: [id number] PT Time Calculation (min) (ACUTE ONLY): 19 min   Charges:   PT Evaluation $Initial PT Evaluation Tier I: 1 Procedure PT Treatments $Gait Training: 8-22 mins   PT G Codes:   PT G-Codes **NOT FOR INPATIENT CLASS** Functional Assessment Tool Used: clinical judgement Functional Limitation: Mobility: Walking and moving around Mobility: Walking and Moving Around Current Status (A2130(G8978): At least 1 percent but less than 20 percent impaired, limited or restricted Mobility: Walking and Moving Around Goal Status [phone]([phone]): 0 percent impaired, limited or restricted Mobility: Walking and Moving Around Discharge Status [phone]([phone]): 0 percent impaired, limited or restricted    Cloie Wooden,KATHrine E 12/24/2014, 11:01 AM Zenovia JarredKati Graceson Nichelson, PT, DPT 12/24/2014 Pager: [phone]

## 2015-01-25 ENCOUNTER — Other Ambulatory Visit: Payer: Self-pay | Admitting: Internal Medicine

## 2015-02-02 ENCOUNTER — Other Ambulatory Visit: Payer: Self-pay | Admitting: Internal Medicine

## 2015-03-22 ENCOUNTER — Telehealth: Payer: Self-pay

## 2015-03-22 NOTE — Telephone Encounter (Signed)
Patient is requesting to be referred to [facility]. Per patient she has been going to [facility] but now has switched from [employer] to [employer]. Her insurance requires a referral for her visits to be authorized. Patients call back number is [phone]

## 2015-03-22 NOTE — Telephone Encounter (Signed)
Do we need a new referral? Please find out what doctor she sees for the referral.

## 2015-03-23 NOTE — Telephone Encounter (Signed)
She sees two doctors at [facility]: Dr. Shelle IronBeane is her surgeon and Dr. Ethelene Halamos gives her the injections. Please complete compass referrals.

## 2015-03-25 NOTE — Telephone Encounter (Signed)
Per patient her insurance does not start till April 1st. I informed patient our [facility] could not [facility] until her insurance was active. Patient to call referrals department  back after the 1st of April to request authorization for Dr Shelle IronBeane and Dr [facility] at [facility].

## 2015-03-29 NOTE — Telephone Encounter (Signed)
Patient's [employer]  Compass now show active and I was able to get insurance authorization for patient to see Dr Ramos/Dr Shelle IronBeane at [facility]. Patient has been made aware she is able to schedule her appointments with their office.  [employer] compass Auth #[id number] Dr Ethelene Halamos and Berkley HarveyAuth #[id number] Dr Shelle IronBeane. I have faxed this information to [facility] on behalf of patient's request.

## 2015-04-03 ENCOUNTER — Telehealth: Payer: Self-pay

## 2015-04-03 NOTE — Telephone Encounter (Signed)
Pt is needing a refill on klonapen  Pt does know it might be Monday before review but would like a call back today to see if this would be possible

## 2015-04-05 MED ORDER — CLONAZEPAM 1 MG PO TABS: 1.0000 mg | ORAL_TABLET | Freq: Two times a day (BID) | ORAL | Status: DC | PRN

## 2015-04-05 NOTE — Telephone Encounter (Signed)
Advised pt that Dr Merla Richesoolittle did give her RFs and [facility] at her req.

## 2015-05-04 ENCOUNTER — Ambulatory Visit (INDEPENDENT_AMBULATORY_CARE_PROVIDER_SITE_OTHER): Payer: 59 | Admitting: Internal Medicine

## 2015-05-04 VITALS — BP 110/78 | HR 67 | Temp 98.5°F | Resp 18 | Ht 65.0 in | Wt 144.1 lb

## 2015-05-04 DIAGNOSIS — M542 Cervicalgia: Secondary | ICD-10-CM

## 2015-05-04 DIAGNOSIS — M5137 Other intervertebral disc degeneration, lumbosacral region: Secondary | ICD-10-CM

## 2015-05-04 DIAGNOSIS — F419 Anxiety disorder, unspecified: Secondary | ICD-10-CM

## 2015-05-04 DIAGNOSIS — M5416 Radiculopathy, lumbar region: Secondary | ICD-10-CM | POA: Diagnosis not present

## 2015-05-04 DIAGNOSIS — G8929 Other chronic pain: Secondary | ICD-10-CM | POA: Diagnosis not present

## 2015-05-04 DIAGNOSIS — R202 Paresthesia of skin: Secondary | ICD-10-CM

## 2015-05-04 MED ORDER — CITALOPRAM HYDROBROMIDE 20 MG PO TABS: 20.0000 mg | ORAL_TABLET | Freq: Every day | ORAL | Status: DC

## 2015-05-04 NOTE — Progress Notes (Signed)
   Subjective:    Patient ID: Carmen Rasmussen, female    DOB: 03/09/1951, 64 y.o.   MRN: [id number] This chart was scribed for Ellamae Siaobert Yonas Bunda, MD by Jolene Provostobert Halas, Medical Scribe. This patient was seen in Room 9 and the patient's care was started a 6:03 PM.  Chief Complaint  Patient presents with  . Follow-up    f/u after back surgery--arm and feet are numb--feels like she is not better but worse  . B12 Injection    requesting a b12 injection today--no energy at all    HPI HPI Comments: Carmen Rasmussen is a 64 y.o. female who [facility] complaining of continued complications from back surgery. Pt states she lost her mother two days before christmas. Pt states she lost her job two days before her back surgery. Pt states her left leg is numb all the time, and she has a large area of burning pain on her back. Pt states she has had difficulty sleeping due to pain in back and shoulders. Pt states she has pain in her left shoulder that radiates down her arm. Pt states that she would like to get on disability.   Daughter providing stress with relationship although granddaughter is the level of her life.  Review of Systems  Constitutional: Negative for fever and chills.  Musculoskeletal: Positive for back pain.  Neurological: Positive for numbness. Negative for weakness.   no chest pain or palpitations     Objective:   Physical Exam  Constitutional: She is oriented to person, place, and time. She appears well-developed and well-nourished. No distress.  HENT:  Head: Normocephalic and atraumatic.  Eyes: Conjunctivae and EOM are normal. Pupils are equal, round, and reactive to light.  Neck: Neck supple. No thyromegaly present.  Stiff neck/tender trapezii  Cardiovascular: Normal rate.   Pulmonary/Chest: Effort normal.  Musculoskeletal: She exhibits no edema.  Scar well-healed  Neurological: She is alert and oriented to person, place, and time. No cranial nerve deficit. Coordination  normal.  Skin: Skin is warm and dry. She is not diaphoretic.  Psychiatric:  Very stressed by her current predicament/tears at time during discussion/thought content normal in judgment sound.  Nursing note and vitals reviewed.       Assessment & Plan:    I have completed the patient encounter in its entirety as documented by the scribe, with editing by me where necessary. Lamberto Dinapoli P. Merla Richesoolittle, M.D. Neck pain, chronic  Lumbar radicular pain  DDD (degenerative disc disease), lumbosacral  Anxiety  Paresthesias in left hand  Meds ordered this encounter  Medications  . HYDROcodone-acetaminophen (NORCO) 10-325 MG per tablet    Sig: Take 1 tablet by mouth every 6 (six) hours as needed.  . citalopram (CELEXA) 20 MG tablet    Sig: Take 1 tablet (20 mg total) by mouth daily.    Dispense:  90 tablet    Refill:  3   she may call for Klonopin refill She is referred to [facility] for therapeutic yoga for back and neck She will continue to follow-up [facility] The left hand will need to be evaluated for carpal tunnel syndrome at some point Apply for disability Discussed counseling Follow-up 3 months-6 months

## 2015-06-03 ENCOUNTER — Encounter: Payer: Self-pay | Admitting: *Deleted

## 2015-09-28 ENCOUNTER — Other Ambulatory Visit: Payer: Self-pay | Admitting: Internal Medicine

## 2015-10-13 ENCOUNTER — Ambulatory Visit: Payer: Self-pay | Admitting: Orthopedic Surgery

## 2015-10-23 ENCOUNTER — Ambulatory Visit (INDEPENDENT_AMBULATORY_CARE_PROVIDER_SITE_OTHER): Payer: 59 | Admitting: Internal Medicine

## 2015-10-23 VITALS — BP 142/76 | HR 85 | Temp 98.4°F | Resp 16 | Ht 65.0 in | Wt 140.0 lb

## 2015-10-23 DIAGNOSIS — M5416 Radiculopathy, lumbar region: Secondary | ICD-10-CM

## 2015-10-23 DIAGNOSIS — S30861D Insect bite (nonvenomous) of abdominal wall, subsequent encounter: Secondary | ICD-10-CM | POA: Diagnosis not present

## 2015-10-23 DIAGNOSIS — M2550 Pain in unspecified joint: Secondary | ICD-10-CM | POA: Diagnosis not present

## 2015-10-23 DIAGNOSIS — W57XXXD Bitten or stung by nonvenomous insect and other nonvenomous arthropods, subsequent encounter: Secondary | ICD-10-CM

## 2015-10-23 NOTE — Progress Notes (Signed)
Subjective:  This chart was scribed for [name], MD by [facility], medical scribe at [facility].The patient was seen in exam room 14 and the patient's care was started at 4:11 PM.   Patient ID: Carmen Rasmussen, female    DOB: October 03, 1951, 64 y.o.   MRN: [id number] Chief Complaint  Patient presents with  . Insect Bite    tick bite on abdomen   HPI HPI Comments: Carmen Rasmussen is a 64 y.o. female who presents to [facility] complaining of tick bite on her abdomen which occurred one month ago. She is concerned about lyme disease because of the tick exposure. She has had generalized body aches, arthralgias, sweats, and a sore throat. No fever. No joint swelling. No rash.   Scheduled to have have follow-up lumbar surgery =surgery to remove a cyst. Her pain is been unremitting since her last injection by Dr. Ethelene Hal and follow-up MRI revealed a troublesome cyst. She would like to renew her handicap tag.  Past Medical History  Diagnosis Date  . Carpal tunnel syndrome   . Edema   . History of shingles     in 5th grade  . Tachycardia   . Hyperextension injury of cervical spine   . Abnormal Pap smear   . Ruptured lumbar disc   . Anxiety   . Arthritis   . GERD (gastroesophageal reflux disease)   . Hypertension     hx of hypetension currently not treated with medication pt has been on metoprolol in past used for essential tremors currently not on medication    Prior to Admission medications   Medication Sig Start Date End Date Taking? Authorizing Provider  citalopram (CELEXA) 20 MG tablet Take 1 tablet (20 mg total) by mouth daily. 05/04/15  Yes Tonye Pearson, MD  clonazePAM (KLONOPIN) 1 MG tablet Take 1 tablet (1 mg total) by mouth 2 (two) times daily as needed for anxiety. 04/05/15  Yes Tonye Pearson, MD  diclofenac (VOLTAREN) 25 MG EC tablet Take 25 mg by mouth once.   Yes Historical Provider, MD  estradiol (ESTRACE) 1 MG tablet  Take 1 tablet (1 mg total) by mouth daily. PATIENT NEEDS OFFICE VISIT FOR ADDITIONAL REFILLS 10/01/15  Yes Tonye Pearson, MD  HYDROcodone-acetaminophen [facility]) 10-325 MG per tablet Take 1 tablet by mouth every 6 (six) hours as needed.   Yes Historical Provider, MD  medroxyPROGESTERone (PROVERA) 2.5 MG tablet Take 1 tablet (2.5 mg total) by mouth daily. PATIENT NEEDS OFFICE VISIT FOR ADDITIONAL REFILLS 10/01/15  Yes Tonye Pearson, MD  methocarbamol (ROBAXIN) 500 MG tablet Take 1 tablet (500 mg total) by mouth 3 (three) times daily between meals as needed for muscle spasms. 12/23/14  Yes [name], MD  medroxyPROGESTERone (PROVERA) 2.5 MG tablet Take 2.5 mg by mouth daily.    Historical Provider, MD   Allergies  Allergen Reactions  . Meloxicam Swelling   Review of Systems  Constitutional: Positive for diaphoresis. Negative for fever.  HENT: Positive for sore throat.   Musculoskeletal: Positive for myalgias.      Objective:  BP 142/76 mmHg  Pulse 85  Temp(Src) 98.4 F (36.9 C) (Oral)  Resp 16  Ht 5\' 5"  (1.651 m)  Wt 140 lb (63.504 kg)  BMI 23.30 kg/m2  SpO2 98% Physical Exam  Constitutional: She is oriented to person, place, and time. She appears well-developed and well-nourished. No distress.  HENT:  Head: Normocephalic and atraumatic.  Eyes: Pupils are equal, round, and reactive to light.  Neck: Normal range of motion.  Cardiovascular: Normal rate and regular rhythm.   Pulmonary/Chest: Effort normal. No respiratory distress.  Musculoskeletal: Normal range of motion.  Neurological: She is alert and oriented to person, place, and time.  Skin: Skin is warm and dry.  Psychiatric: She has a normal mood and affect. Her behavior is normal.  Nursing note and vitals reviewed.     Assessment & Plan:  Tick bite of abdomen, subsequent encounter - Plan: Lyme Aby, Western Blot IgG & IgM w/bands, Sedimentation rate  Arthralgia - Plan: Rheumatoid factor, Sedimentation rate, CBC  with Differential/Platelet  Lumbar radicular pain  Addendum labs Results for orders placed or performed in visit on 10/23/15-----pending   Lyme Aby, Western Blot IgG & IgM w/bands  Result Value Ref Range   B burgdorferi IgG Abs (IB)     Lyme Disease 18 kD IgG     Lyme Disease 23 kD IgG     Lyme Disease 28 kD IgG     Lyme Disease 30 kD IgG     Lyme Disease 39 kD IgG     Lyme Disease 41 kD IgG     Lyme Disease 45 kD IgG     Lyme Disease 58 kD IgG     Lyme Disease 66 kD IgG     Lyme Disease 93 kD IgG     B burgdorferi IgM Abs (IB)     Lyme Disease 23 kD IgM     Lyme Disease 39 kD IgM     Lyme Disease 41 kD IgM    Rheumatoid factor  Result Value Ref Range   Rhuematoid fact SerPl-aCnc <10 <=14 IU/mL  Sedimentation rate  Result Value Ref Range   Sed Rate 12 0 - 30 mm/hr  CBC with Differential/Platelet  Result Value Ref Range   WBC 6.1 4.0 - 10.5 K/uL   RBC 4.02 3.87 - 5.11 MIL/uL   Hemoglobin 12.9 12.0 - 15.0 g/dL   HCT 39.0 36.0 - 46.0 %   MCV 97.0 78.0 - 100.0 fL   MCH 32.1 26.0 - 34.0 pg   MCHC 33.1 30.0 - 36.0 g/dL   RDW 13.2 11.5 - 15.5 %   Platelets 326 150 - 400 K/uL   MPV 9.9 8.6 - 12.4 fL   Neutrophils Relative % 47 43 - 77 %   Neutro Abs 2.9 1.7 - 7.7 K/uL   Lymphocytes Relative 39 12 - 46 %   Lymphs Abs 2.4 0.7 - 4.0 K/uL   Monocytes Relative 11 3 - 12 %   Monocytes Absolute 0.7 0.1 - 1.0 K/uL   Eosinophils Relative 3 0 - 5 %   Eosinophils Absolute 0.2 0.0 - 0.7 K/uL   Basophils Relative 0 0 - 1 %   Basophils Absolute 0.0 0.0 - 0.1 K/uL   Smear Review Criteria for review not met      By signing my name below, I, Nadim Abuhashem, attest that this documentation has been prepared under the direction and in the presence of Tami Lin, MD.  Electronically Signed: Lora Havens, medical scribe. 10/23/2015, 4:14 PM. I have completed the patient encounter in its entirety as documented by the scribe, with editing by me where necessary. Robert P.  Laney Pastor, M.D.

## 2015-10-24 LAB — CBC WITH DIFFERENTIAL/PLATELET
Basophils Absolute: 0 10*3/uL (ref 0.0–0.1)
Basophils Relative: 0 % (ref 0–1)
Eosinophils Absolute: 0.2 10*3/uL (ref 0.0–0.7)
Eosinophils Relative: 3 % (ref 0–5)
HCT: 39 % (ref 36.0–46.0)
Hemoglobin: 12.9 g/dL (ref 12.0–15.0)
Lymphocytes Relative: 39 % (ref 12–46)
Lymphs Abs: 2.4 10*3/uL (ref 0.7–4.0)
MCH: 32.1 pg (ref 26.0–34.0)
MCHC: 33.1 g/dL (ref 30.0–36.0)
MCV: 97 fL (ref 78.0–100.0)
MPV: 9.9 fL (ref 8.6–12.4)
Monocytes Absolute: 0.7 10*3/uL (ref 0.1–1.0)
Monocytes Relative: 11 % (ref 3–12)
Neutro Abs: 2.9 10*3/uL (ref 1.7–7.7)
Neutrophils Relative %: 47 % (ref 43–77)
Platelets: 326 10*3/uL (ref 150–400)
RBC: 4.02 MIL/uL (ref 3.87–5.11)
RDW: 13.2 % (ref 11.5–15.5)
WBC: 6.1 10*3/uL (ref 4.0–10.5)

## 2015-10-24 LAB — RHEUMATOID FACTOR: Rhuematoid fact SerPl-aCnc: <10 IU/mL (ref <=14)

## 2015-10-24 LAB — SEDIMENTATION RATE: Sed Rate: 12 mm/hr (ref 0–30)

## 2015-10-25 ENCOUNTER — Telehealth: Payer: Self-pay

## 2015-10-25 MED ORDER — CLONAZEPAM 1 MG PO TABS: 1.0000 mg | ORAL_TABLET | Freq: Two times a day (BID) | ORAL | Status: DC | PRN

## 2015-10-25 NOTE — Telephone Encounter (Signed)
Pt states she forgot to get her prescriptions when she left the other day, can't come back for them but [facility] and wanted to know if we would send to her pharmacy Please call pt at [phone]    [facility] [address]

## 2015-10-25 NOTE — Telephone Encounter (Signed)
Meds ordered this encounter  Medications  . clonazePAM (KLONOPIN) 1 MG tablet    Sig: Take 1 tablet (1 mg total) by mouth 2 (two) times daily as needed for anxiety.    Dispense:  30 tablet    Refill:  4   Call in

## 2015-10-26 LAB — LYME ABY, WSTRN BLT IGG & IGM W/BANDS
B burgdorferi IgG Abs (IB): NEGATIVE
B burgdorferi IgM Abs (IB): NEGATIVE
Lyme Disease 18 kD IgG: NONREACTIVE
Lyme Disease 23 kD IgG: NONREACTIVE
Lyme Disease 23 kD IgM: NONREACTIVE
Lyme Disease 28 kD IgG: NONREACTIVE
Lyme Disease 30 kD IgG: NONREACTIVE
Lyme Disease 39 kD IgG: REACTIVE — AB
Lyme Disease 39 kD IgM: NONREACTIVE
Lyme Disease 41 kD IgG: NONREACTIVE
Lyme Disease 41 kD IgM: NONREACTIVE
Lyme Disease 45 kD IgG: NONREACTIVE
Lyme Disease 58 kD IgG: NONREACTIVE
Lyme Disease 66 kD IgG: NONREACTIVE
Lyme Disease 93 kD IgG: NONREACTIVE

## 2015-10-26 NOTE — Telephone Encounter (Signed)
Rx called in. Pt would like to know her lab results. Can we review and I can call her and let her know.

## 2015-10-28 ENCOUNTER — Telehealth: Payer: Self-pay

## 2015-10-28 NOTE — Telephone Encounter (Signed)
Pt is looking for lab results

## 2015-10-28 NOTE — Telephone Encounter (Signed)
Pt.notified

## 2015-10-29 ENCOUNTER — Telehealth: Payer: Self-pay

## 2015-10-29 NOTE — Telephone Encounter (Signed)
LMOM that labs were normal and that they were in MyChart

## 2015-11-15 ENCOUNTER — Other Ambulatory Visit (HOSPITAL_COMMUNITY): Payer: Self-pay | Admitting: *Deleted

## 2015-11-15 NOTE — Patient Instructions (Addendum)
Carmen Rasmussen  11/15/2015   Your procedure is scheduled on: 11-24-15  [facility]  [facility] to 3rd floor to  Short Stay Center at 800 AM.  Call this number if you have problems the morning of surgery [phone]   Remember: ONLY 1 PERSON MAY GO WITH YOU TO SHORT STAY TO GET  READY MORNING OF YOUR SURGERY.  Do not eat food or drink liquids :After Midnight.     Take these medicines the morning of surgery with A SIP OF WATER:  Hydrocodone if needed, Clonazepam (Klonopin) if needed                               You may not have any metal on your body including hair pins and              piercings  Do not wear jewelry, make-up, lotions, powders or perfumes, deodorant             Do not wear nail polish.  Do not shave  48 hours prior to surgery.              Men may shave face and neck.   Do not bring valuables to the hospital. [facility] IS NOT             RESPONSIBLE   FOR VALUABLES.  Contacts, dentures or bridgework may not be worn into surgery.  Leave suitcase in the car. After surgery it may be brought to your room.     Patients discharged the day of surgery will not be allowed to drive home.  Name and phone number of your driver:  Special Instructions: N/A              Please read over the following fact sheets you were given: _____________________________________________________________________             [facility] Pre[facility] Before surgery, you can play an important role.  Because skin is not sterile, your skin needs to be as free of germs as possible.  You can reduce the number of germs on your skin by washing with CHG (chlorahexidine gluconate) soap before surgery.  CHG is an antiseptic cleaner which kills germs and bonds with the skin to continue killing germs even after washing. Please DO NOT use if you have an allergy to CHG or antibacterial soaps.  If your skin becomes reddened/irritated stop  using the CHG and inform your nurse when you arrive at Short Stay. Do not shave (including legs and underarms) for at least 48 hours prior to the first CHG shower.  You may shave your face/neck. Please follow these instructions carefully:  1.  Shower with CHG Soap the night before surgery and the  morning of Surgery.  2.  If you choose to wash your hair, wash your hair first as usual with your  normal  shampoo.  3.  After you shampoo, rinse your hair and body thoroughly to remove the  shampoo.                           4.  Use CHG as you would any other liquid soap.  You can apply chg directly  to the skin and wash  Gently with a scrungie or clean washcloth.  5.  Apply the CHG Soap to your body ONLY FROM THE NECK DOWN.   Do not use on face/ open                           Wound or open sores. Avoid contact with eyes, ears mouth and genitals (private parts).                       Wash face,  Genitals (private parts) with your normal soap.             6.  Wash thoroughly, paying special attention to the area where your surgery  will be performed.  7.  Thoroughly rinse your body with warm water from the neck down.  8.  DO NOT shower/wash with your normal soap after using and rinsing off  the CHG Soap.                9.  Pat yourself dry with a clean towel.            10.  Wear clean pajamas.            11.  Place clean sheets on your bed the night of your first shower and do not  sleep with pets. Day of Surgery : Do not apply any lotions/deodorants the morning of surgery.  Please wear clean clothes to the hospital/surgery center.  FAILURE TO FOLLOW THESE INSTRUCTIONS MAY RESULT IN THE CANCELLATION OF YOUR SURGERY PATIENT SIGNATURE_________________________________  NURSE SIGNATURE__________________________________  ________________________________________________________________________   Adam Phenix  An incentive spirometer is a tool that can help keep your  lungs clear and active. This tool measures how well you are filling your lungs with each breath. Taking long deep breaths may help reverse or decrease the chance of developing breathing (pulmonary) problems (especially infection) following:  A long period of time when you are unable to move or be active. BEFORE THE PROCEDURE   If the spirometer includes an indicator to show your best effort, your nurse or respiratory therapist will set it to a desired goal.  If possible, sit up straight or lean slightly forward. Try not to slouch.  Hold the incentive spirometer in an upright position. INSTRUCTIONS FOR USE  1. Sit on the edge of your bed if possible, or sit up as far as you can in bed or on a chair. 2. Hold the incentive spirometer in an upright position. 3. Breathe out normally. 4. Place the mouthpiece in your mouth and seal your lips tightly around it. 5. Breathe in slowly and as deeply as possible, raising the piston or the ball toward the top of the column. 6. Hold your breath for 3-5 seconds or for as long as possible. Allow the piston or ball to fall to the bottom of the column. 7. Remove the mouthpiece from your mouth and breathe out normally. 8. Rest for a few seconds and repeat Steps 1 through 7 at least 10 times every 1-2 hours when you are awake. Take your time and take a few normal breaths between deep breaths. 9. The spirometer may include an indicator to show your best effort. Use the indicator as a goal to work toward during each repetition. 10. After each set of 10 deep breaths, practice coughing to be sure your lungs are clear. If you have an incision (the cut made at the time of surgery),  support your incision when coughing by placing a pillow or rolled up towels firmly against it. Once you are able to get out of bed, walk around indoors and cough well. You may stop using the incentive spirometer when instructed by your caregiver.  RISKS AND COMPLICATIONS  Take your time so  you do not get dizzy or light-headed.  If you are in pain, you may need to take or ask for pain medication before doing incentive spirometry. It is harder to take a deep breath if you are having pain. AFTER USE  Rest and breathe slowly and easily.  It can be helpful to keep track of a log of your progress. Your caregiver can provide you with a simple table to help with this. If you are using the spirometer at home, follow these instructions: [place] IF:   You are having difficultly using the spirometer.  You have trouble using the spirometer as often as instructed.  Your pain medication is not giving enough relief while using the spirometer.  You develop fever of 100.5 F (38.1 C) or higher. SEEK IMMEDIATE MEDICAL CARE IF:   You cough up bloody sputum that had not been present before.  You develop fever of 102 F (38.9 C) or greater.  You develop worsening pain at or near the incision site. MAKE SURE YOU:   Understand these instructions.  Will watch your condition.  Will get help right away if you are not doing well or get worse. Document Released: 04/23/2007 Document Revised: 03/04/2012 Document Reviewed: 06/24/2007 [facility] Patient Information 2014 [place], [place].   ________________________________________________________________________

## 2015-11-16 ENCOUNTER — Encounter (HOSPITAL_COMMUNITY)
Admission: RE | Admit: 2015-11-16 | Discharge: 2015-11-16 | Disposition: A | Discharge status: Home / Self Care | Payer: 59 | Source: Ambulatory Visit | Attending: Specialist | Admitting: Specialist

## 2015-11-16 ENCOUNTER — Ambulatory Visit (HOSPITAL_COMMUNITY)
Admission: RE | Admit: 2015-11-16 | Discharge: 2015-11-16 | Disposition: A | Discharge status: Home / Self Care | Payer: 59 | Source: Ambulatory Visit | Attending: Orthopedic Surgery | Admitting: Orthopedic Surgery

## 2015-11-16 ENCOUNTER — Ambulatory Visit: Payer: Self-pay | Admitting: Orthopedic Surgery

## 2015-11-16 ENCOUNTER — Encounter (HOSPITAL_COMMUNITY): Payer: Self-pay

## 2015-11-16 DIAGNOSIS — Z01818 Encounter for other preprocedural examination: Secondary | ICD-10-CM | POA: Diagnosis present

## 2015-11-16 DIAGNOSIS — M4806 Spinal stenosis, lumbar region: Secondary | ICD-10-CM | POA: Insufficient documentation

## 2015-11-16 DIAGNOSIS — M5136 Other intervertebral disc degeneration, lumbar region: Secondary | ICD-10-CM | POA: Diagnosis not present

## 2015-11-16 DIAGNOSIS — Z01812 Encounter for preprocedural laboratory examination: Secondary | ICD-10-CM | POA: Insufficient documentation

## 2015-11-16 DIAGNOSIS — M48061 Spinal stenosis, lumbar region without neurogenic claudication: Secondary | ICD-10-CM

## 2015-11-16 HISTORY — DX: Tremor, unspecified: R25.1

## 2015-11-16 LAB — BASIC METABOLIC PANEL
Anion gap: 7 (ref 5–15)
BUN: 15 mg/dL (ref 6–20)
CO2: 29 mmol/L (ref 22–32)
Calcium: 8.9 mg/dL (ref 8.9–10.3)
Chloride: 104 mmol/L (ref 101–111)
Creatinine, Ser: 0.89 mg/dL (ref 0.44–1.00)
GFR calc Af Amer: >60 mL/min (ref >60)
GFR calc non Af Amer: >60 mL/min (ref >60)
Glucose, Bld: 90 mg/dL (ref 65–99)
Potassium: 5.1 mmol/L (ref 3.5–5.1)
Sodium: 140 mmol/L (ref 135–145)

## 2015-11-16 LAB — CBC
HCT: 36.4 % (ref 36.0–46.0)
Hemoglobin: 11.9 g/dL — ABNORMAL LOW (ref 12.0–15.0)
MCH: 32.4 pg (ref 26.0–34.0)
MCHC: 32.7 g/dL (ref 30.0–36.0)
MCV: 99.2 fL (ref 78.0–100.0)
Platelets: 275 10*3/uL (ref 150–400)
RBC: 3.67 MIL/uL — ABNORMAL LOW (ref 3.87–5.11)
RDW: 12.5 % (ref 11.5–15.5)
WBC: 5.1 10*3/uL (ref 4.0–10.5)

## 2015-11-16 LAB — SURGICAL PCR SCREEN
MRSA, PCR: NEGATIVE
Staphylococcus aureus: NEGATIVE
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
## 2015-11-17 ENCOUNTER — Other Ambulatory Visit: Payer: Self-pay

## 2015-11-17 MED ORDER — ESTRADIOL 1 MG PO TABS: 1.0000 mg | ORAL_TABLET | Freq: Every day | ORAL | Status: DC

## 2015-11-17 MED ORDER — MEDROXYPROGESTERONE ACETATE 2.5 MG PO TABS: 2.5000 mg | ORAL_TABLET | Freq: Every day | ORAL | Status: DC

## 2015-11-17 NOTE — Progress Notes (Signed)
ekg 12-14-14 epic

## 2015-11-17 NOTE — Telephone Encounter (Signed)
Dr Merla Richesoolittle, we put message on last RF that pt needed an OV, and she came in, but don't see any discussion of chronic meds. Do you want me to give just one more month and have her come back, or longer RFS?

## 2015-11-17 NOTE — Telephone Encounter (Signed)
She could have 5 refills and somehow I didn't get this request til today's from you

## 2015-11-17 NOTE — Telephone Encounter (Signed)
Dr Merla Richesoolittle, see my note below, but I went ahead and filled one month of each for pt since she was out and I had told her that we could send it in today. Do you want to OK more RFs or have her come back for another visit?

## 2015-11-17 NOTE — Telephone Encounter (Signed)
[facility] because they faxed a prescription request on 11/18 and patient is now out of medication. Patient needs refills for 30 of  Provera 25mg  and estradiol 1mg 

## 2015-11-22 ENCOUNTER — Encounter: Payer: Self-pay | Admitting: Internal Medicine

## 2015-11-22 MED ORDER — ESTRADIOL 1 MG PO TABS: 1.0000 mg | ORAL_TABLET | Freq: Every day | ORAL | Status: DC

## 2015-11-22 MED ORDER — MEDROXYPROGESTERONE ACETATE 2.5 MG PO TABS: 2.5000 mg | ORAL_TABLET | Freq: Every day | ORAL | Status: DC

## 2015-11-24 ENCOUNTER — Encounter (HOSPITAL_COMMUNITY): Payer: Self-pay | Admitting: *Deleted

## 2015-11-24 ENCOUNTER — Ambulatory Visit (HOSPITAL_COMMUNITY)
Admission: RE | Admit: 2015-11-24 | Discharge: 2015-11-25 | Disposition: A | Discharge status: Home / Self Care | Payer: 59 | Source: Ambulatory Visit | Attending: Specialist | Admitting: Specialist

## 2015-11-24 ENCOUNTER — Encounter (HOSPITAL_COMMUNITY)
Admission: RE | Disposition: A | Discharge status: Home / Self Care | Payer: Self-pay | Source: Ambulatory Visit | Attending: Specialist

## 2015-11-24 ENCOUNTER — Ambulatory Visit (HOSPITAL_COMMUNITY): Payer: 59

## 2015-11-24 ENCOUNTER — Ambulatory Visit (HOSPITAL_COMMUNITY): Payer: 59 | Admitting: Anesthesiology

## 2015-11-24 DIAGNOSIS — M48061 Spinal stenosis, lumbar region without neurogenic claudication: Secondary | ICD-10-CM | POA: Diagnosis present

## 2015-11-24 DIAGNOSIS — M4806 Spinal stenosis, lumbar region: Secondary | ICD-10-CM | POA: Diagnosis not present

## 2015-11-24 DIAGNOSIS — F419 Anxiety disorder, unspecified: Secondary | ICD-10-CM | POA: Insufficient documentation

## 2015-11-24 DIAGNOSIS — M5136 Other intervertebral disc degeneration, lumbar region: Secondary | ICD-10-CM | POA: Insufficient documentation

## 2015-11-24 DIAGNOSIS — R000 Tachycardia, unspecified: Secondary | ICD-10-CM | POA: Insufficient documentation

## 2015-11-24 DIAGNOSIS — G250 Essential tremor: Secondary | ICD-10-CM | POA: Diagnosis not present

## 2015-11-24 DIAGNOSIS — M4807 Spinal stenosis, lumbosacral region: Secondary | ICD-10-CM | POA: Insufficient documentation

## 2015-11-24 DIAGNOSIS — M7138 Other bursal cyst, other site: Secondary | ICD-10-CM | POA: Diagnosis not present

## 2015-11-24 DIAGNOSIS — G9619 Other disorders of meninges, not elsewhere classified: Secondary | ICD-10-CM | POA: Insufficient documentation

## 2015-11-24 DIAGNOSIS — Z888 Allergy status to other drugs, medicaments and biological substances status: Secondary | ICD-10-CM | POA: Insufficient documentation

## 2015-11-24 DIAGNOSIS — K219 Gastro-esophageal reflux disease without esophagitis: Secondary | ICD-10-CM | POA: Insufficient documentation

## 2015-11-24 DIAGNOSIS — D649 Anemia, unspecified: Secondary | ICD-10-CM | POA: Insufficient documentation

## 2015-11-24 DIAGNOSIS — Z419 Encounter for procedure for purposes other than remedying health state, unspecified: Secondary | ICD-10-CM

## 2015-11-24 HISTORY — PX: LUMBAR LAMINECTOMY/DECOMPRESSION MICRODISCECTOMY: SHX5026

## 2015-11-24 SURGERY — LUMBAR LAMINECTOMY/DECOMPRESSION MICRODISCECTOMY 1 LEVEL
Anesthesia: General | Site: Back | Laterality: Right

## 2015-11-24 MED ORDER — CLONAZEPAM 1 MG PO TABS
1.0000 mg | ORAL_TABLET | Freq: Three times a day (TID) | ORAL | Status: DC | PRN
  Administered 2015-11-24: 1 mg via ORAL
  Filled 2015-11-24: qty 1

## 2015-11-24 MED ORDER — PHENOL 1.4 % MT LIQD
1.0000 | OROMUCOSAL | Status: DC | PRN
  Administered 2015-11-24: 1 via OROMUCOSAL
  Filled 2015-11-24: qty 177

## 2015-11-24 MED ORDER — ONDANSETRON HCL 4 MG/2ML IJ SOLN
INTRAMUSCULAR | Status: DC | PRN
  Administered 2015-11-24: 4 mg via INTRAVENOUS

## 2015-11-24 MED ORDER — FENTANYL CITRATE (PF) 100 MCG/2ML IJ SOLN
25.0000 ug | INTRAMUSCULAR | Status: DC | PRN
  Administered 2015-11-24: 25 ug via INTRAVENOUS
  Administered 2015-11-24: 50 ug via INTRAVENOUS

## 2015-11-24 MED ORDER — THROMBIN 5000 UNITS EX SOLR
CUTANEOUS | Status: AC
  Filled 2015-11-24: qty 10000

## 2015-11-24 MED ORDER — ONDANSETRON HCL 4 MG/2ML IJ SOLN
INTRAMUSCULAR | Status: AC
  Filled 2015-11-24: qty 2

## 2015-11-24 MED ORDER — NEOSTIGMINE METHYLSULFATE 10 MG/10ML IV SOLN
INTRAVENOUS | Status: DC | PRN
  Administered 2015-11-24: 4 mg via INTRAVENOUS

## 2015-11-24 MED ORDER — ROCURONIUM BROMIDE 100 MG/10ML IV SOLN
INTRAVENOUS | Status: DC | PRN
  Administered 2015-11-24: 30 mg via INTRAVENOUS

## 2015-11-24 MED ORDER — BISACODYL 5 MG PO TBEC: 5.0000 mg | DELAYED_RELEASE_TABLET | Freq: Every day | ORAL | Status: DC | PRN

## 2015-11-24 MED ORDER — METHOCARBAMOL 1000 MG/10ML IJ SOLN
500.0000 mg | Freq: Four times a day (QID) | INTRAMUSCULAR | Status: DC | PRN
  Administered 2015-11-24: 500 mg via INTRAVENOUS
  Filled 2015-11-24 (×2): qty 5

## 2015-11-24 MED ORDER — ALUM & MAG HYDROXIDE-SIMETH 200-200-20 MG/5ML PO SUSP: 30.0000 mL | Freq: Four times a day (QID) | ORAL | Status: DC | PRN

## 2015-11-24 MED ORDER — DEXAMETHASONE SODIUM PHOSPHATE 10 MG/ML IJ SOLN
INTRAMUSCULAR | Status: DC | PRN
  Administered 2015-11-24: 10 mg via INTRAVENOUS

## 2015-11-24 MED ORDER — SODIUM CHLORIDE 0.9 % IR SOLN
Status: AC
  Filled 2015-11-24: qty 1

## 2015-11-24 MED ORDER — GLYCOPYRROLATE 0.2 MG/ML IJ SOLN
INTRAMUSCULAR | Status: DC | PRN
  Administered 2015-11-24: 0.6 mg via INTRAVENOUS

## 2015-11-24 MED ORDER — MIDAZOLAM HCL 2 MG/2ML IJ SOLN
INTRAMUSCULAR | Status: AC
  Filled 2015-11-24: qty 2

## 2015-11-24 MED ORDER — METHOCARBAMOL 500 MG PO TABS: 500.0000 mg | ORAL_TABLET | Freq: Three times a day (TID) | ORAL | Status: DC | PRN

## 2015-11-24 MED ORDER — CEFAZOLIN SODIUM 1-5 GM-% IV SOLN
1.0000 g | Freq: Three times a day (TID) | INTRAVENOUS | Status: AC
  Administered 2015-11-24 – 2015-11-25 (×3): 1 g via INTRAVENOUS
  Filled 2015-11-24 (×3): qty 50

## 2015-11-24 MED ORDER — BUPIVACAINE-EPINEPHRINE 0.5% -1:200000 IJ SOLN
INTRAMUSCULAR | Status: DC | PRN
  Administered 2015-11-24: 11 mL

## 2015-11-24 MED ORDER — OXYCODONE-ACETAMINOPHEN 5-325 MG PO TABS
1.0000 | ORAL_TABLET | ORAL | Status: DC | PRN
  Administered 2015-11-24 (×2): 1 via ORAL
  Administered 2015-11-24 – 2015-11-25 (×2): 2 via ORAL
  Filled 2015-11-24 (×2): qty 1
  Filled 2015-11-24 (×2): qty 2

## 2015-11-24 MED ORDER — BUPIVACAINE-EPINEPHRINE (PF) 0.5% -1:200000 IJ SOLN
INTRAMUSCULAR | Status: AC
  Filled 2015-11-24: qty 30

## 2015-11-24 MED ORDER — ONDANSETRON HCL 4 MG PO TABS: 8.0000 mg | ORAL_TABLET | Freq: Three times a day (TID) | ORAL | Status: DC | PRN

## 2015-11-24 MED ORDER — RISAQUAD PO CAPS
1.0000 | ORAL_CAPSULE | Freq: Every day | ORAL | Status: DC
  Administered 2015-11-24 – 2015-11-25 (×2): 1 via ORAL
  Filled 2015-11-24 (×2): qty 1

## 2015-11-24 MED ORDER — HYDROMORPHONE HCL 1 MG/ML IJ SOLN
0.5000 mg | INTRAMUSCULAR | Status: DC | PRN
  Administered 2015-11-24: 1 mg via INTRAVENOUS
  Filled 2015-11-24: qty 1

## 2015-11-24 MED ORDER — DOCUSATE SODIUM 100 MG PO CAPS: 100.0000 mg | ORAL_CAPSULE | Freq: Two times a day (BID) | ORAL | Status: DC | PRN

## 2015-11-24 MED ORDER — DOCUSATE SODIUM 100 MG PO CAPS
100.0000 mg | ORAL_CAPSULE | Freq: Two times a day (BID) | ORAL | Status: DC
  Administered 2015-11-25: 100 mg via ORAL

## 2015-11-24 MED ORDER — ACETAMINOPHEN 650 MG RE SUPP: 650.0000 mg | RECTAL | Status: DC | PRN

## 2015-11-24 MED ORDER — ONDANSETRON HCL 4 MG/2ML IJ SOLN: 4.0000 mg | INTRAMUSCULAR | Status: DC | PRN

## 2015-11-24 MED ORDER — ONDANSETRON HCL 8 MG PO TABS: 8.0000 mg | ORAL_TABLET | Freq: Three times a day (TID) | ORAL | Status: DC | PRN

## 2015-11-24 MED ORDER — METHOCARBAMOL 500 MG PO TABS
500.0000 mg | ORAL_TABLET | Freq: Four times a day (QID) | ORAL | Status: DC | PRN
  Administered 2015-11-24 – 2015-11-25 (×2): 500 mg via ORAL
  Filled 2015-11-24 (×2): qty 1

## 2015-11-24 MED ORDER — FENTANYL CITRATE (PF) 250 MCG/5ML IJ SOLN
INTRAMUSCULAR | Status: DC | PRN
  Administered 2015-11-24: 100 ug via INTRAVENOUS
  Administered 2015-11-24 (×3): 25 ug via INTRAVENOUS
  Administered 2015-11-24: 50 ug via INTRAVENOUS
  Administered 2015-11-24: 25 ug via INTRAVENOUS

## 2015-11-24 MED ORDER — LIDOCAINE HCL (CARDIAC) 20 MG/ML IV SOLN
INTRAVENOUS | Status: AC
  Filled 2015-11-24: qty 5

## 2015-11-24 MED ORDER — ESTRADIOL 1 MG PO TABS
1.0000 mg | ORAL_TABLET | Freq: Every day | ORAL | Status: DC
  Administered 2015-11-24 – 2015-11-25 (×2): 1 mg via ORAL
  Filled 2015-11-24 (×2): qty 1

## 2015-11-24 MED ORDER — SODIUM CHLORIDE 0.9 % IJ SOLN
INTRAMUSCULAR | Status: AC
  Filled 2015-11-24: qty 10

## 2015-11-24 MED ORDER — MIDAZOLAM HCL 5 MG/5ML IJ SOLN
INTRAMUSCULAR | Status: DC | PRN
  Administered 2015-11-24: 2 mg via INTRAVENOUS

## 2015-11-24 MED ORDER — MENTHOL 3 MG MT LOZG: 1.0000 | LOZENGE | OROMUCOSAL | Status: DC | PRN

## 2015-11-24 MED ORDER — FENTANYL CITRATE (PF) 100 MCG/2ML IJ SOLN
INTRAMUSCULAR | Status: AC
  Administered 2015-11-24: 50 ug via INTRAVENOUS
  Filled 2015-11-24: qty 2

## 2015-11-24 MED ORDER — OXYCODONE-ACETAMINOPHEN 5-325 MG PO TABS: 1.0000 | ORAL_TABLET | ORAL | Status: DC | PRN

## 2015-11-24 MED ORDER — SENNOSIDES-DOCUSATE SODIUM 8.6-50 MG PO TABS: 1.0000 | ORAL_TABLET | Freq: Every evening | ORAL | Status: DC | PRN

## 2015-11-24 MED ORDER — GLYCOPYRROLATE 0.2 MG/ML IJ SOLN
INTRAMUSCULAR | Status: AC
  Filled 2015-11-24: qty 3

## 2015-11-24 MED ORDER — PROPOFOL 10 MG/ML IV BOLUS
INTRAVENOUS | Status: AC
  Filled 2015-11-24: qty 20

## 2015-11-24 MED ORDER — FENTANYL CITRATE (PF) 250 MCG/5ML IJ SOLN
INTRAMUSCULAR | Status: AC
  Filled 2015-11-24: qty 5

## 2015-11-24 MED ORDER — PROMETHAZINE HCL 25 MG/ML IJ SOLN
INTRAMUSCULAR | Status: AC
  Filled 2015-11-24: qty 1

## 2015-11-24 MED ORDER — MAGNESIUM CITRATE PO SOLN: 1.0000 | Freq: Once | ORAL | Status: DC | PRN

## 2015-11-24 MED ORDER — CEFAZOLIN SODIUM-DEXTROSE 2-3 GM-% IV SOLR
INTRAVENOUS | Status: AC
  Filled 2015-11-24: qty 50

## 2015-11-24 MED ORDER — LIDOCAINE HCL (CARDIAC) 20 MG/ML IV SOLN
INTRAVENOUS | Status: DC | PRN
  Administered 2015-11-24: 50 mg via INTRAVENOUS

## 2015-11-24 MED ORDER — DEXAMETHASONE SODIUM PHOSPHATE 10 MG/ML IJ SOLN
INTRAMUSCULAR | Status: AC
  Filled 2015-11-24: qty 1

## 2015-11-24 MED ORDER — PROPOFOL 10 MG/ML IV BOLUS
INTRAVENOUS | Status: DC | PRN
  Administered 2015-11-24: 150 mg via INTRAVENOUS

## 2015-11-24 MED ORDER — CEFAZOLIN SODIUM-DEXTROSE 2-3 GM-% IV SOLR
2.0000 g | INTRAVENOUS | Status: AC
  Administered 2015-11-24: 2 g via INTRAVENOUS

## 2015-11-24 MED ORDER — ZOLPIDEM TARTRATE 5 MG PO TABS: 5.0000 mg | ORAL_TABLET | Freq: Every evening | ORAL | Status: DC | PRN

## 2015-11-24 MED ORDER — HYDROCODONE-ACETAMINOPHEN 5-325 MG PO TABS
1.0000 | ORAL_TABLET | ORAL | Status: DC | PRN
  Administered 2015-11-24 – 2015-11-25 (×2): 2 via ORAL
  Filled 2015-11-24 (×2): qty 2

## 2015-11-24 MED ORDER — KCL IN DEXTROSE-NACL 20-5-0.45 MEQ/L-%-% IV SOLN
INTRAVENOUS | Status: DC
  Administered 2015-11-24: 50 mL/h via INTRAVENOUS
  Filled 2015-11-24 (×2): qty 1000

## 2015-11-24 MED ORDER — PROMETHAZINE HCL 25 MG/ML IJ SOLN
6.2500 mg | INTRAMUSCULAR | Status: DC | PRN
  Administered 2015-11-24: 6.25 mg via INTRAVENOUS

## 2015-11-24 MED ORDER — MEDROXYPROGESTERONE ACETATE 2.5 MG PO TABS
2.5000 mg | ORAL_TABLET | Freq: Every day | ORAL | Status: DC
  Administered 2015-11-24 – 2015-11-25 (×2): 2.5 mg via ORAL
  Filled 2015-11-24 (×2): qty 1

## 2015-11-24 MED ORDER — SUCCINYLCHOLINE CHLORIDE 20 MG/ML IJ SOLN
INTRAMUSCULAR | Status: DC | PRN
  Administered 2015-11-24: 100 mg via INTRAVENOUS

## 2015-11-24 MED ORDER — LACTATED RINGERS IV SOLN
INTRAVENOUS | Status: DC
  Administered 2015-11-24: 1000 mL via INTRAVENOUS
  Administered 2015-11-24: 10:00:00 via INTRAVENOUS

## 2015-11-24 MED ORDER — CITALOPRAM HYDROBROMIDE 20 MG PO TABS
20.0000 mg | ORAL_TABLET | Freq: Every day | ORAL | Status: DC
  Administered 2015-11-24 – 2015-11-25 (×2): 20 mg via ORAL
  Filled 2015-11-24 (×2): qty 1

## 2015-11-24 MED ORDER — SODIUM CHLORIDE 0.9 % IR SOLN
Status: DC | PRN
  Administered 2015-11-24: 500 mL

## 2015-11-24 MED ORDER — ACETAMINOPHEN 325 MG PO TABS
650.0000 mg | ORAL_TABLET | ORAL | Status: DC | PRN
Start: 2015-11-24 — End: 2015-11-25

## 2015-11-24 SURGICAL SUPPLY — 48 items
BAG ZIPLOCK 12X15 (MISCELLANEOUS) IMPLANT
CLEANER TIP ELECTROSURG 2X2 (MISCELLANEOUS) ×3 IMPLANT
CLOSURE WOUND 1/2 X4 (GAUZE/BANDAGES/DRESSINGS) ×1
CLOTH 2% CHLOROHEXIDINE 3PK (PERSONAL CARE ITEMS) ×3 IMPLANT
DRAPE MICROSCOPE LEICA (MISCELLANEOUS) ×3 IMPLANT
DRAPE POUCH INSTRU U-SHP 10X18 (DRAPES) ×3 IMPLANT
DRAPE SHEET LG 3/4 BI-LAMINATE (DRAPES) ×3 IMPLANT
DRAPE SURG 17X11 SM STRL (DRAPES) ×3 IMPLANT
DRAPE UTILITY XL STRL (DRAPES) ×3 IMPLANT
DRSG AQUACEL AG ADV 3.5X 4 (GAUZE/BANDAGES/DRESSINGS) ×3 IMPLANT
DRSG AQUACEL AG ADV 3.5X 6 (GAUZE/BANDAGES/DRESSINGS) IMPLANT
DURAFORM SPONGE 2X2 SINGLE (Neuro Prosthesis/Implant) ×3 IMPLANT
DURAPREP 26ML APPLICATOR (WOUND CARE) ×3 IMPLANT
DURASEAL SPINE SEALANT 3ML (MISCELLANEOUS) IMPLANT
ELECT BLADE TIP CTD 4 INCH (ELECTRODE) IMPLANT
ELECT REM PT RETURN 9FT ADLT (ELECTROSURGICAL) ×3
ELECTRODE REM PT RTRN 9FT ADLT (ELECTROSURGICAL) ×1 IMPLANT
GLOVE BIOGEL PI IND STRL 7.0 (GLOVE) ×1 IMPLANT
GLOVE BIOGEL PI INDICATOR 7.0 (GLOVE) ×2
GLOVE SURG SS PI 7.0 STRL IVOR (GLOVE) ×3 IMPLANT
GLOVE SURG SS PI 7.5 STRL IVOR (GLOVE) ×3 IMPLANT
GLOVE SURG SS PI 8.0 STRL IVOR (GLOVE) ×6 IMPLANT
GOWN STRL REUS W/TWL XL LVL3 (GOWN DISPOSABLE) ×6 IMPLANT
IV CATH 14GX2 1/4 (CATHETERS) ×3 IMPLANT
KIT BASIN OR (CUSTOM PROCEDURE TRAY) ×3 IMPLANT
KIT POSITIONING SURG ANDREWS (MISCELLANEOUS) ×3 IMPLANT
MANIFOLD NEPTUNE II (INSTRUMENTS) ×3 IMPLANT
NEEDLE SPNL 18GX3.5 QUINCKE PK (NEEDLE) ×6 IMPLANT
PACK LAMINECTOMY ORTHO (CUSTOM PROCEDURE TRAY) ×3 IMPLANT
PATTIES SURGICAL .5 X.5 (GAUZE/BANDAGES/DRESSINGS) IMPLANT
PATTIES SURGICAL .75X.75 (GAUZE/BANDAGES/DRESSINGS) IMPLANT
PATTIES SURGICAL 1X1 (DISPOSABLE) IMPLANT
RUBBERBAND STERILE (MISCELLANEOUS) ×6 IMPLANT
SPONGE SURGIFOAM ABS GEL 100 (HEMOSTASIS) ×3 IMPLANT
STAPLER VISISTAT (STAPLE) IMPLANT
STRIP CLOSURE SKIN 1/2X4 (GAUZE/BANDAGES/DRESSINGS) ×2 IMPLANT
SUT NURALON 4 0 TR CR/8 (SUTURE) IMPLANT
SUT PROLENE 3 0 PS 2 (SUTURE) ×3 IMPLANT
SUT VIC AB 1 CT1 27 (SUTURE)
SUT VIC AB 1 CT1 27XBRD ANTBC (SUTURE) IMPLANT
SUT VIC AB 1-0 CT2 27 (SUTURE) ×3 IMPLANT
SUT VIC AB 2-0 CT1 27 (SUTURE)
SUT VIC AB 2-0 CT1 TAPERPNT 27 (SUTURE) IMPLANT
SUT VIC AB 2-0 CT2 27 (SUTURE) ×3 IMPLANT
SYR 3ML LL SCALE MARK (SYRINGE) IMPLANT
TOWEL OR 17X26 10 PK STRL BLUE (TOWEL DISPOSABLE) ×3 IMPLANT
TOWEL OR NON WOVEN STRL DISP B (DISPOSABLE) IMPLANT
YANKAUER SUCT BULB TIP NO VENT (SUCTIONS) IMPLANT

## 2015-11-24 NOTE — Anesthesia Preprocedure Evaluation (Addendum)
Anesthesia Evaluation  Patient identified by MRN, date of birth, ID band Patient awake    Reviewed: Allergy & Precautions, H&P , NPO status , Patient's Chart, lab work & pertinent test results  Airway Mallampati: II  TM Distance: >3 FB Neck ROM: Limited    Dental no notable dental hx. (+) Chipped, Loose, Caps,    Pulmonary neg pulmonary ROS,    Pulmonary exam normal breath sounds clear to auscultation       Cardiovascular Exercise Tolerance: Good (-) angina(-) CAD and (-) Past MI Normal cardiovascular exam Rhythm:Regular Rate:Normal     Neuro/Psych PSYCHIATRIC DISORDERS Anxiety Benign essential tremor; last beta blocker use >6 months ago  Neuromuscular disease negative psych ROS   GI/Hepatic negative GI ROS, Neg liver ROS, GERD  Medicated,  Endo/Other  negative endocrine ROS  Renal/GU negative Renal ROS  negative genitourinary   Musculoskeletal  (+) Arthritis , Osteoarthritis,  C-spine issues   Abdominal   Peds negative pediatric ROS (+)  Hematology negative hematology ROS (+) Blood dyscrasia, anemia ,   Anesthesia Other Findings Day of surgery medications reviewed with the patient.  Reproductive/Obstetrics negative OB ROS                           Anesthesia Physical Anesthesia Plan  ASA: II  Anesthesia Plan: General   Post-op Pain Management:    Induction: Intravenous  Airway Management Planned: Oral ETT and Video Laryngoscope Planned  Additional Equipment:   Intra-op Plan:   Post-operative Plan: Extubation in OR  Informed Consent: I have reviewed the patients History and Physical, chart, labs and discussed the procedure including the risks, benefits and alternatives for the proposed anesthesia with the patient or authorized representative who has indicated his/her understanding and acceptance.   Dental advisory given  Plan Discussed with: CRNA  Anesthesia Plan Comments:           Anesthesia Quick Evaluation

## 2015-11-24 NOTE — Anesthesia Postprocedure Evaluation (Signed)
Anesthesia Post Note  Patient: Carmen Rasmussen  Procedure(s) Performed: Procedure(s) (LRB): MICRO LUMBER DECOMPRESSION L5-S1 ON RIGHT REMOVAL OF SYNOVIAL CYST (Right)  Patient location during evaluation: PACU Anesthesia Type: General Level of consciousness: awake and alert Pain management: pain level controlled Vital Signs Assessment: post-procedure vital signs reviewed and stable Respiratory status: spontaneous breathing, nonlabored ventilation, respiratory function stable and patient connected to nasal cannula oxygen Cardiovascular status: blood pressure returned to baseline and stable Postop Assessment: no signs of nausea or vomiting Anesthetic complications: no    Last Vitals:  Filed Vitals:   11/24/15 1245 11/24/15 1300  BP: 111/64 119/66  Pulse: 70 74  Temp: 36.9 C 36.7 C  Resp: 14 16    Last Pain:  Filed Vitals:   11/24/15 1303  PainSc: Asleep    LLE Motor Response: Purposeful movement LLE Sensation: Full sensation, No numbness, No tingling RLE Motor Response: Purposeful movement RLE Sensation: Full sensation, No numbness, No tingling      Cecile HearingStephen Edward Annalyn Blecher

## 2015-11-24 NOTE — Transfer of Care (Signed)
Immediate Anesthesia Transfer of Care Note  Patient: Carmen Rasmussen  Procedure(s) Performed: Procedure(s): MICRO LUMBER DECOMPRESSION L5-S1 ON RIGHT REMOVAL OF SYNOVIAL CYST (Right)  Patient Location: PACU  Anesthesia Type:General  Level of Consciousness: awake, alert , oriented and patient cooperative  Airway & Oxygen Therapy: Patient Spontanous Breathing and Patient connected to face mask oxygen  Post-op Assessment: Report given to RN and Post -op Vital signs reviewed and stable  Post vital signs: Reviewed and stable  Last Vitals:  Filed Vitals:   11/24/15 0752  BP: 128/69  Pulse: 95  Temp: 36.9 C  Resp: 18    Complications: No apparent anesthesia complications

## 2015-11-24 NOTE — Progress Notes (Signed)
PT Cancellation Note  Patient Details Name: Jiles Harolderesa G Hettinger MRN: [id number] DOB: [birth year]   Cancelled Treatment:    Reason Eval/Treat Not Completed: Other (comment)up w/ nursing multiple times. Check back tomorrow.   Rada HayHill, Lisvet Rasheed Elizabeth 11/24/2015, 5:35 PM

## 2015-11-24 NOTE — Anesthesia Procedure Notes (Addendum)
Procedure Name: Intubation Date/Time: 11/24/2015 10:03 AM Performed by: Delphia GratesHANDLER, Tylyn Derwin Pre-anesthesia Checklist: Emergency Drugs available, Suction available, Patient being monitored and Patient identified Patient Re-evaluated:Patient Re-evaluated prior to inductionOxygen Delivery Method: Circle system utilized Preoxygenation: Pre-oxygenation with 100% oxygen Intubation Type: IV induction Ventilation: Mask ventilation without difficulty Laryngoscope Size: Glidescope and 4 Grade View: Grade III Tube type: Oral Tube size: 7.5 mm Number of attempts: 2 Airway Equipment and Method: Bougie stylet (Previous glidescope intubation d/t chronic neck pain. ) Placement Confirmation: ETT inserted through vocal cords under direct vision,  breath sounds checked- equal and bilateral and positive ETCO2 Secured at: 21 cm Tube secured with: Tape Dental Injury: Teeth and Oropharynx as per pre-operative assessment  Difficulty Due To: Difficulty was anticipated, Difficult Airway- due to anterior larynx, Difficult Airway- due to dentition and Difficult Airway- due to reduced neck mobility

## 2015-11-24 NOTE — Brief Op Note (Signed)
11/24/2015  11:40 AM  PATIENT:  Carmen Rasmussen  64 y.o. female  PRE-OPERATIVE DIAGNOSIS:  STENOSIS AND SYNOVIAL  CYST L5-S1 ON RIGHT  POST-OPERATIVE DIAGNOSIS:  STENOSIS AND SYNOVIAL  CYST L5-S1 ON RIGHT  PROCEDURE:  Procedure(s): MICRO LUMBER DECOMPRESSION L5-S1 ON RIGHT REMOVAL OF SYNOVIAL CYST (Right)  SURGEON:  Surgeon(s) and Role:    * Jene EveryJeffrey Markeda Narvaez, MD - Primary  PHYSICIAN ASSISTANT:   ASSISTANTS: Bissell   ANESTHESIA:   general  EBL:  Total I/O In: 900 [I.V.:900] Out: 25 [Blood:25]  BLOOD ADMINISTERED:none  DRAINS: none   LOCAL MEDICATIONS USED:  MARCAINE     SPECIMEN:  Source of Specimen:  Cyst L5S1  DISPOSITION OF SPECIMEN:  PATHOLOGY  COUNTS:  YES  TOURNIQUET:  * No tourniquets in log *  DICTATION: .Other Dictation: Dictation Number [id number]  PLAN OF CARE: Admit for overnight observation  PATIENT DISPOSITION:  PACU - hemodynamically stable.   Delay start of Pharmacological VTE agent (>24hrs) due to surgical blood loss or risk of bleeding: yes
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
## 2015-11-24 NOTE — Interval H&P Note (Signed)
History and Physical Interval Note:  11/24/2015 7:25 AM  Carmen Rasmussen  has presented today for surgery, with the diagnosis of STENOSIS AND SYNOVIAL  CYST L5-S1 ON RIGHT  The various methods of treatment have been discussed with the patient and family. After consideration of risks, benefits and other options for treatment, the patient has consented to  Procedure(s): MICRO LUMBER DECOMPRESSION L5-S1 ON RIGHT REMOVAL OF SYNOVIAL CYST (Right) as a surgical intervention .  The patient's history has been reviewed, patient examined, no change in status, stable for surgery.  I have reviewed the patient's chart and labs.  Questions were answered to the patient's satisfaction.     [name] C

## 2015-11-24 NOTE — Progress Notes (Signed)
Floor notifies(bonnie) pt will be in 1603 in 20 minutes, needs pump and pulsox.

## 2015-11-24 NOTE — Discharge Instructions (Signed)
Walk As Tolerated utilizing back precautions.  No bending, twisting, or lifting.  No driving for 2 weeks.   °Aquacel dressing may remain in place until follow up. May shower with aquacel dressing in place. If the dressing peels off or becomes saturated, you may remove aquacel dressing and place gauze and tape dressing which should be kept clean and dry and changed daily. Do not remove steri-strips if they are present. °See Dr. Orvetta Danielski in office in 10 to 14 days. Begin taking aspirin 81mg per day starting 4 days after your surgery if not allergic to aspirin or on another blood thinner. °Walk daily even outside. Use a cane or walker only if necessary. °Avoid sitting on soft sofas. ° °

## 2015-11-25 DIAGNOSIS — M4807 Spinal stenosis, lumbosacral region: Secondary | ICD-10-CM | POA: Diagnosis not present

## 2015-11-25 MED ORDER — DICLOFENAC SODIUM 25 MG PO TBEC: 25.0000 mg | DELAYED_RELEASE_TABLET | Freq: Once | ORAL | Status: DC

## 2015-11-25 MED ORDER — IBUPROFEN 800 MG PO TABS: 800.0000 mg | ORAL_TABLET | Freq: Every day | ORAL | Status: DC

## 2015-11-25 MED ORDER — DICLOFENAC SODIUM 75 MG PO TBEC: 75.0000 mg | DELAYED_RELEASE_TABLET | Freq: Two times a day (BID) | ORAL | Status: DC

## 2015-11-25 NOTE — Care Management Note (Signed)
Case Management Note  Patient Details  Name: Carmen Rasmussen MRN: [id number] Date of Birth: [birth year]  Subjective/Objective:    64 y.o. F admitted   11/24/2015 for  MICRO LUMBER DECOMPRESSION L5-S1 ON RIGHT REMOVAL OF SYNOVIAL CYST (Right). Lives home alone with sister nearby for assistance. No PT/OT follow recommended. Pt will be purchasing 3-n-1 BSC for personal use. No further CM needs.              Action/Plan:Will be available should any CM needs arise.    Expected Discharge Date:                  Expected Discharge Plan:  Home/Self Care  In-House Referral:     Discharge planning Services  CM Consult  Post Acute Care Choice:    Choice offered to:     DME Arranged:  3-N-1 DME Agency:  [facility]  HH Arranged:    HH Agency:     Status of Service:  Completed, signed off  Medicare Important Message Given:    Date Medicare IM Given:    Medicare IM give by:    Date Additional Medicare IM Given:    Additional Medicare Important Message give by:     If discussed at Long Length of Stay Meetings, dates discussed:    Additional Comments:  [name], [name], RN 11/25/2015, 9:25 AM

## 2015-11-25 NOTE — Evaluation (Signed)
Physical Therapy Evaluation Patient Details Name: Carmen Rasmussen MRN: [id number] DOB: 1951/11/29 Today's Date: 11/25/2015   History of Present Illness  s/p decompression/cyst removal L5-S1  Clinical Impression  Pt admitted with above diagnosis for observation, she did very well with physical therapy today, she is modified independent with bed mobility, transfers, and ambulation and was able to re-state her precautions. Pt is ready to d/c home.        Follow Up Recommendations No PT follow up    Equipment Recommendations  None recommended by PT    Recommendations for Other Services       Precautions / Restrictions Precautions Precautions: Back Precaution Comments: precaution booklet was provided by OT, reviewed precautions with pt  Restrictions Weight Bearing Restrictions: No      Mobility  Bed Mobility Overal bed mobility: Modified Independent Bed Mobility: Rolling;Sidelying to Sit;Sit to Sidelying Rolling: Modified independent (Device/Increase time) Sidelying to sit: Modified independent (Device/Increase time)     Sit to sidelying: Modified independent (Device/Increase time) General bed mobility comments: pt was able to demonstrate correct technique for log rolling without cues from therapist  Transfers Overall transfer level: Needs assistance Equipment used: None Transfers: Sit to/from Stand Sit to Stand: Modified independent (Device/Increase time)         General transfer comment: did not require cues, demostrated correct technique   Ambulation/Gait Ambulation/Gait assistance: Modified independent (Device/Increase time) Ambulation Distance (Feet): 250 Feet Assistive device: None Gait Pattern/deviations: Step-through pattern Gait velocity: normal for age    General Gait Details: no apparent deficits observed, pt was able to converse during ambulation   Stairs            Wheelchair Mobility    Modified Rankin (Stroke Patients Only)        Balance                                             Pertinent Vitals/Pain Pain Assessment: 0-10 Pain Score: 7  Pain Location: back during mobility Pain Descriptors / Indicators: Sore Pain Intervention(s): Limited activity within patient's tolerance;Monitored during session;Premedicated before session;Repositioned    Home Living Family/patient expects to be discharged to:: Private residence Living Arrangements: Alone Available Help at Discharge: Family;Friend(s) Type of Home: House Home Access: Level entry     Home Layout: Able to live on main level with bedroom/bathroom;Two level Home Equipment: Adaptive equipment      Prior Function Level of Independence: Independent               Hand Dominance        Extremity/Trunk Assessment   Upper Extremity Assessment: Overall WFL for tasks assessed           Lower Extremity Assessment: Overall WFL for tasks assessed      Cervical / Trunk Assessment: Normal  Communication   Communication: No difficulties  Cognition Arousal/Alertness: Awake/alert Behavior During Therapy: WFL for tasks assessed/performed Overall Cognitive Status: Within Functional Limits for tasks assessed                      General Comments General comments (skin integrity, edema, etc.): Pt states she knows her precautions and will be compliant with them after this surgery. Pt reports she wasn't compliant with precautioos after her first back surgery 8 months ago and states she learned her lesson. Discussed strengthening progeram exercises with pt  when she is released by MD.     Exercises        Assessment/Plan    PT Assessment Patent does not need any further PT services  PT Diagnosis Difficulty walking   PT Problem List    PT Treatment Interventions     PT Goals (Current goals can be found in the Care Plan section) Acute Rehab PT Goals Patient Stated Goal: to go home to check on my puppy  PT Goal  Formulation: With patient Time For Goal Achievement: 12/02/15 Potential to Achieve Goals: Good    Frequency     Barriers to discharge        Co-evaluation               End of Session Equipment Utilized During Treatment: Gait belt Activity Tolerance: Patient tolerated treatment well Patient left: Other (comment) (pt wants to remain in standing as that makes her back feel better) Nurse Communication: Mobility status    Functional Assessment Tool Used: clinical judgement Functional Limitation: Mobility: Walking and moving around Mobility: Walking and Moving Around Current Status (Z6109): At least 1 percent but less than 20 percent impaired, limited or restricted Mobility: Walking and Moving Around Goal Status [phone]): At least 1 percent but less than 20 percent impaired, limited or restricted Mobility: Walking and Moving Around Discharge Status [phone]): At least 1 percent but less than 20 percent impaired, limited or restricted    Time: 0850-0907 PT Time Calculation (min) (ACUTE ONLY): 17 min   Charges:   PT Evaluation $Initial PT Evaluation Tier I: 1 Procedure     PT G Codes:   PT G-Codes **NOT FOR INPATIENT CLASS** Functional Assessment Tool Used: clinical judgement Functional Limitation: Mobility: Walking and moving around Mobility: Walking and Moving Around Current Status (B1478): At least 1 percent but less than 20 percent impaired, limited or restricted Mobility: Walking and Moving Around Goal Status [phone]): At least 1 percent but less than 20 percent impaired, limited or restricted Mobility: Walking and Moving Around Discharge Status [phone]): At least 1 percent but less than 20 percent impaired, limited or restricted    [employer], SPT 11/25/2015, 1:42 PM

## 2015-11-25 NOTE — Evaluation (Signed)
Occupational Therapy Evaluation Patient Details Name: Carmen Rasmussen MRN: [id number] DOB: [birth year] Today's Date: 11/25/2015    History of Present Illness s/p decompression/cyst removal L5-S1   Clinical Impression   This 64 year old female was admitted for the above surgery. All education was completed.  No further OT is needed at this time    Follow Up Recommendations  No OT follow up    Equipment Recommendations  3 in 1 bedside comode (pt wants to know out of pocket cost)    Recommendations for Other Services       Precautions / Restrictions Precautions Precautions: Back Restrictions Weight Bearing Restrictions: No      Mobility Bed Mobility Overal bed mobility: Modified Independent             General bed mobility comments: demonstrated log roll for getting up  Transfers   Equipment used: None             General transfer comment: supervision sit to stand    Balance                                            ADL Overall ADL's : Needs assistance/impaired                                       General ADL Comments: when Ot arrived, pt was reaching across body to put item back in her bag:  educated on back precautions and twisting.  Pt had back sx last Dec.  Educated on back precautions with adls.  She is able to perform ADLs with set up; she does have a reacher to retrieve items and educated not to pick up anything heavy.  Pt crosses legs for socks--reports no pain/pulling.  She states that tying is sometimes difficult. Talked to her about pretying loosely and stepping into shoes.  Pt sponge bathed awhile after last sx.  Educated on sidestepping into tub when ready and getting appliques for non-skid. She will have assistance with puppy; she does not have a fenced yard.  Encouraged her not to walk dog herself.  Puppy has pee pads and someone has offered to walk dog.  She can pour water from a pitcher--she is aware of  lifting precautions. Pt reports she did not have difficulty with hygiene last sx. Educated on toilet aide, if needed  Pt reports she got up to bathroom with staff a little while ago; would like to know out of pocket cost for 3:1 commode.  This made her transfer much easier.     Vision     Perception     Praxis      Pertinent Vitals/Pain Pain Assessment: 0-10 Pain Score: 1  Pain Location: back sore; no leg pain Pain Intervention(s): Limited activity within patient's tolerance;Monitored during session;Repositioned;Premedicated before session     Hand Dominance     Extremity/Trunk Assessment Upper Extremity Assessment Upper Extremity Assessment: Overall WFL for tasks assessed           Communication Communication Communication: No difficulties   Cognition Arousal/Alertness: Awake/alert Behavior During Therapy: WFL for tasks assessed/performed Overall Cognitive Status: Within Functional Limits for tasks assessed                     General Comments  Exercises       Shoulder Instructions      Home Living Family/patient expects to be discharged to:: Private residence Living Arrangements: Alone                 Bathroom Shower/Tub: Tub/shower unit Shower/tub characteristics: Engineer, building services: Standard     Home Equipment: Merchant navy officer          Prior Functioning/Environment Level of Independence: Independent             OT Diagnosis: Generalized weakness   OT Problem List:     OT Treatment/Interventions:      OT Goals(Current goals can be found in the care plan section)    OT Frequency:     Barriers to D/C:            Co-evaluation              End of Session    Activity Tolerance: Patient tolerated treatment well Patient left: in bed (eob with breakfast)   Time: 4098-1191 OT Time Calculation (min): 26 min Charges:  OT General Charges $OT Visit: 1 Procedure OT Evaluation $Initial OT Evaluation Tier  I: 1 Procedure G-Codes: OT G-codes **NOT FOR INPATIENT CLASS** Functional Assessment Tool Used: clinical judgment/observation Functional Limitation: Self care Self Care Current Status (Y7829): At least 1 percent but less than 20 percent impaired, limited or restricted Self Care Goal Status (F6213): At least 1 percent but less than 20 percent impaired, limited or restricted Self Care Discharge Status [phone]): At least 1 percent but less than 20 percent impaired, limited or restricted  [facility] 11/25/2015, 8:56 AM   Marica Otter, OTR/L [phone] 11/25/2015
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
## 2015-11-25 NOTE — Progress Notes (Signed)
Subjective: 1 Day Post-Op Procedure(s) (LRB): MICRO LUMBER DECOMPRESSION L5-S1 ON RIGHT REMOVAL OF SYNOVIAL CYST (Right) Patient reports pain as mild.  Seen in AM rounds by myself and Dr. Shelle IronBeane. Doing well, reports leg pain improved. Feels ready for D/C home today.  Objective: Vital signs in last 24 hours: Temp:  [97.3 F (36.3 C)-98.6 F (37 C)] 98.6 F (37 C) (12/01 0900) Pulse Rate:  [66-94] 91 (12/01 0900) Resp:  [13-17] 16 (12/01 0900) BP: (107-127)/(52-91) 127/59 mmHg (12/01 0900) SpO2:  [98 %-100 %] 98 % (12/01 0900) Weight:  [64.411 kg (142 lb)] 64.411 kg (142 lb) (11/30 1300)  Intake/Output from previous day: 11/30 0701 - 12/01 0700 In: 2240 [P.O.:540; I.V.:1650; IV Piggyback:50] Out: 25 [Blood:25] Intake/Output this shift:    No results for input(s): HGB in the last 72 hours. No results for input(s): WBC, RBC, HCT, PLT in the last 72 hours. No results for input(s): NA, K, CL, CO2, BUN, CREATININE, GLUCOSE, CALCIUM in the last 72 hours. No results for input(s): LABPT, INR in the last 72 hours.  Neurologically intact ABD soft Neurovascular intact Sensation intact distally Intact pulses distally Dorsiflexion/Plantar flexion intact Incision: dressing C/D/I and no drainage No cellulitis present Compartment soft no sign of DVT  Assessment/Plan: 1 Day Post-Op Procedure(s) (LRB): MICRO LUMBER DECOMPRESSION L5-S1 ON RIGHT REMOVAL OF SYNOVIAL CYST (Right) Advance diet Up with therapy D/C IV fluids  Discussed D/C instructions, Lspine precautions, dressing instructions D/C home today Follow up in office in 2 weeks  Sieanna Vanstone M. 11/25/2015, 10:24 AM

## 2015-11-25 NOTE — Op Note (Signed)
NAMRowland Lathe:  Blondin, Torian                ACCOUNT NO.:  [id number]  MEDICAL RECORD NO.:  [id number]  LOCATION:  1603                         [facility]  PHYSICIAN:  Jene EveryJeffrey Jamison Yuhasz, M.D.    DATE OF BIRTH:  [birth year]  DATE OF PROCEDURE:  11/24/2015 DATE OF DISCHARGE:                              OPERATIVE REPORT   PREOPERATIVE DIAGNOSIS:  Spinal stenosis, synovial cyst, L5-S1 right.  POSTOPERATIVE DIAGNOSIS:  Spinal stenosis, synovial cyst, L5-S1 right.  PROCEDURES PERFORMED:  Redo microlumbar decompression, L5-S1 right with foraminotomies L5-S1, excision of synovial cyst.  ANESTHESIA:  General.  ASSISTANT:  Lanna PocheJacqueline Bissell, PA.  HISTORY:  A 64 year old, female with history of lumbar decompression L5- S1, did well.  Following that, had decompression, had recurrent pain despite conservative treatment, MRI indicating a large synovial cyst extruding into the foramen and into the thecal sac compressing S1 nerve root.  She had been refractory to conservative treatment, was indicated for redo decompression, excision of synovial cyst.  She had hypertrophic facet.  We discussed possibility of fusion in the future, etc. though she had multilevel disk degeneration.  Risk and benefits were discussed including bleeding, infection, damage to neurovascular structure, DVT, PE, anesthetic complications, etc.  TECHNIQUE:  With the patient in supine position, after induction of adequate general anesthesia, 2 g Kefzol, placed prone on the [place] frame.  All bony prominences were well padded.  Lumbar region was prepped and draped in usual sterile fashion.  Two 18-gauge spinal needle was utilized to localize the 5-1 interspace confirmed with x-ray. Previous surgical incision was utilized, 2 cm in length.  Subcutaneous tissue was dissected.  Electrocautery was utilized to achieve hemostasis.  Dorsolumbar fascia identified, divided in line with skin incision.  Hypertrophic paraspinous muscle  elevated from lamina 5 and 1. Extensive epidural fibrosis was noted and ligamentum and facet hypertrophy.  Skeletonized the previous hemilaminotomy with a straight curette.  Portion of the synovial cyst was noted to be present exiting posteriorly from the 5-1 facet as well.  I used a micro osteotome to remove a portion of the inferior process of 5, approximately 50%.  I then skeletonized the medial edge of the superior articulating process of S1.  Significant synovial tissue was noted within the facet joint here.  This was removed with pituitary, sent to Pathology.  This extended I guess into foramen of S1 and 5 and against the thecal sac. Following skeletonization of the superior articulating process of S1, I used a 2-mm Kerrison, I removed it and decompressed the lateral recess to the medial border of the pedicle protecting the nerve root of 5 and removing a small osteophyte extending into the foramen of 5 and felt was impingement on the root.  This was decompressed that well.  Extended the foraminotomy of S1 after protecting neural elements with neuro patties. After removal of the synovial cyst, there was an area of attenuation on the lateral aspect of the distal S1 root.  No CSF leakage even under Valsalva.  After confirmatory radiograph obtained, and also demonstrated no evidence of disk herniation, I felt small area of Duraform was flickable over this attenuated area.  I then placed a 1.5 x 1  cm Duraform patch over this attenuated area of the lateral aspect of the S1 nerve root sleeve.  Prior to that, a neural probe passed freely up the foramen of L5 and S1 without compression.  We felt a combination of the hypertrophic facet ligamentum, residual ligamentum flavum, and the synovial cyst extending into the lateral recess was responsible for the neural compression.  Again, we removed the synovial lining within the joint.  Then we used bipolar cautery, within the joint as well.   The remaining aspect of the facet joint, care of the electrocautery and cauterized the synovium within that.  Following this, there was no active bleeding.  We felt we had an excellent decompression.  No evidence of CSF leakage or active bleeding and removed the [facility] retractor, copiously irrigated the wound.  I then closed the dorsolumbar fascia 1 Vicryl interrupted figure-of-eight sutures, subcu with 2-0, and skin with subcuticular Prolene.  Sterile dressing applied, placed supine on the hospital bed, extubated without difficulty, and transported to the recovery room in satisfactory condition. The patient tolerated the procedure well.  No complications.  Assistant, Dr. Lanna Poche, PA.  Minimal blood loss.     [name], M.D.     Cordelia Pen  D:  11/24/2015  T:  11/25/2015  Job:  [id number]

## 2015-12-08 ENCOUNTER — Telehealth: Payer: Self-pay

## 2015-12-08 NOTE — Telephone Encounter (Signed)
Pt wants to let Merla RichesDoolittle know that she filed for disabilities. Dr. Jillyn HiddenBean [facility] will [facility] writing [facility] to ask for her records.  Please advise if you have any more questions  [phone]

## 2015-12-17 DIAGNOSIS — Z0271 Encounter for disability determination: Secondary | ICD-10-CM

## 2015-12-25 ENCOUNTER — Ambulatory Visit: Payer: 59

## 2016-02-09 ENCOUNTER — Ambulatory Visit (INDEPENDENT_AMBULATORY_CARE_PROVIDER_SITE_OTHER): Payer: BLUE CROSS/BLUE SHIELD | Admitting: Internal Medicine

## 2016-02-09 ENCOUNTER — Encounter: Payer: Self-pay | Admitting: Internal Medicine

## 2016-02-09 VITALS — BP 130/76 | HR 87 | Temp 98.6°F | Resp 16 | Ht 66.0 in | Wt 140.0 lb

## 2016-02-09 DIAGNOSIS — F419 Anxiety disorder, unspecified: Secondary | ICD-10-CM

## 2016-02-09 DIAGNOSIS — G8929 Other chronic pain: Secondary | ICD-10-CM | POA: Diagnosis not present

## 2016-02-09 DIAGNOSIS — M542 Cervicalgia: Secondary | ICD-10-CM | POA: Diagnosis not present

## 2016-02-09 DIAGNOSIS — M5432 Sciatica, left side: Secondary | ICD-10-CM | POA: Diagnosis not present

## 2016-02-09 DIAGNOSIS — M48061 Spinal stenosis, lumbar region without neurogenic claudication: Secondary | ICD-10-CM

## 2016-02-09 DIAGNOSIS — M5137 Other intervertebral disc degeneration, lumbosacral region: Secondary | ICD-10-CM | POA: Diagnosis not present

## 2016-02-09 DIAGNOSIS — M4806 Spinal stenosis, lumbar region: Secondary | ICD-10-CM | POA: Diagnosis not present

## 2016-02-09 MED ORDER — DULOXETINE HCL 60 MG PO CPEP: 60.0000 mg | ORAL_CAPSULE | Freq: Every day | ORAL | Status: DC

## 2016-02-09 MED ORDER — CYCLOBENZAPRINE HCL 10 MG PO TABS: 10.0000 mg | ORAL_TABLET | Freq: Every day | ORAL | Status: DC

## 2016-02-09 MED ORDER — TRAMADOL HCL 50 MG PO TABS: 50.0000 mg | ORAL_TABLET | Freq: Three times a day (TID) | ORAL | Status: DC | PRN
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
## 2016-02-11 ENCOUNTER — Telehealth: Payer: Self-pay

## 2016-02-11 NOTE — Telephone Encounter (Signed)
Pt forgot to ask dr Merla Riches for her medication for trimmers and a note for jury duty  Best number [phone]

## 2016-02-12 MED ORDER — PROPRANOLOL HCL ER 60 MG PO CP24: 60.0000 mg | ORAL_CAPSULE | Freq: Every day | ORAL | Status: DC

## 2016-02-12 NOTE — Telephone Encounter (Signed)
As long as creatinine level stays normal you are OK Don't take more than recommended doses of any medication and this will protect kidneys

## 2016-02-16 NOTE — Telephone Encounter (Signed)
Dr. Merla Riches - please advise.

## 2016-02-16 NOTE — Telephone Encounter (Signed)
PATIENT'S JURY DUTY IS Monday March 06, 2016 AT THE [place] [facility]SE. SHE SAID DR. DOOLITTLE KNOWS THAT SHE HAS HAD 2 BACK SURGERIES AND CAN NOT SIT LONG. PLEASE CALL HER WHEN IT CAN BE PICKED UP. BEST PHONE [phone] (CELL)  [facility]

## 2016-02-22 ENCOUNTER — Encounter: Payer: Self-pay | Admitting: Internal Medicine

## 2016-02-22 NOTE — Telephone Encounter (Signed)
Sent letter that needs my sig

## 2016-03-13 ENCOUNTER — Telehealth: Payer: Self-pay

## 2016-03-13 NOTE — Telephone Encounter (Signed)
Pt is calling to see if she can take the DULoxetine (CYMBALTA) 60 MG capsule along with the citalopram (CELEXA) 20 MG tablet? The pharmacist wanted her to confirm with Dr. Merla Richesoolittle before taking both. Please advise.

## 2016-03-14 ENCOUNTER — Encounter: Payer: Self-pay | Admitting: Internal Medicine

## 2016-03-14 NOTE — Telephone Encounter (Signed)
NO NO---would change from celexa to cymbalta if she could afford it--not both at once

## 2016-03-15 NOTE — Telephone Encounter (Signed)
Called pt, she states she spoke with Dr. Merla Riche[facility]olittle through [facility].

## 2016-03-30 ENCOUNTER — Other Ambulatory Visit: Payer: Self-pay | Admitting: Physical Medicine and Rehabilitation

## 2016-03-30 DIAGNOSIS — M5416 Radiculopathy, lumbar region: Secondary | ICD-10-CM

## 2016-04-05 ENCOUNTER — Encounter: Payer: Self-pay | Admitting: Internal Medicine

## 2016-04-05 ENCOUNTER — Ambulatory Visit (INDEPENDENT_AMBULATORY_CARE_PROVIDER_SITE_OTHER): Payer: BLUE CROSS/BLUE SHIELD | Admitting: Internal Medicine

## 2016-04-05 VITALS — BP 131/64 | HR 75 | Temp 98.7°F | Resp 16 | Ht 66.0 in | Wt 132.0 lb

## 2016-04-05 DIAGNOSIS — M542 Cervicalgia: Secondary | ICD-10-CM

## 2016-04-05 DIAGNOSIS — M5137 Other intervertebral disc degeneration, lumbosacral region: Secondary | ICD-10-CM | POA: Diagnosis not present

## 2016-04-05 DIAGNOSIS — F419 Anxiety disorder, unspecified: Secondary | ICD-10-CM

## 2016-04-05 DIAGNOSIS — K591 Functional diarrhea: Secondary | ICD-10-CM

## 2016-04-05 DIAGNOSIS — G8929 Other chronic pain: Secondary | ICD-10-CM | POA: Diagnosis not present

## 2016-04-05 MED ORDER — CLONAZEPAM 1 MG PO TABS: 1.0000 mg | ORAL_TABLET | Freq: Two times a day (BID) | ORAL | Status: DC | PRN

## 2016-04-05 MED ORDER — DIPHENOXYLATE-ATROPINE 2.5-0.025 MG PO TABS: 1.0000 | ORAL_TABLET | Freq: Four times a day (QID) | ORAL | Status: DC | PRN

## 2016-04-05 MED ORDER — CITALOPRAM HYDROBROMIDE 20 MG PO TABS: 20.0000 mg | ORAL_TABLET | Freq: Every day | ORAL | Status: DC

## 2016-04-05 NOTE — Progress Notes (Signed)
   Subjective:    Patient ID: Carmen Rasmussen, female    DOB: 1951/01/07, 65 y.o.   MRN: [id number]  HPI  Patient Active Problem List   Diagnosis Date Noted  . DDD (degenerative disc disease), lumbosacral 08/30/2014    Priority: High  . Neck pain, chronic 04/07/2012    Priority: Medium  . Anxiety 04/07/2012    Priority: Medium  . Spinal stenosis of lumbar region 11/24/2015  . HNP (herniated nucleus pulposus), lumbar 12/23/2014  . Lumbar radicular pain 08/30/2014  . Sciatica of left side 03/24/2014  . Neck pain 04/07/2012  . Tremor 04/07/2012  . Palpitations 06/17/2011  . Carpal tunnel syndrome   . Idiopathic edema   . History of shingles    She feels surgery gone wrong!!!Is not recovering  Had to stop cymbalta due to side effects(daily diarrhea) so still in pain so Dr Shelle IronBeane did injection which worked- --MRI next week//ramos wants elec stim implant/she's against///also gives HC 120/mo tho she rarely does that much  celexa restarted yesterday  Diarrhea   Review of Systems     Objective:   Physical Exam BP 131/64 mmHg  Pulse 75  Temp(Src) 98.7 F (37.1 C)  Resp 16  Ht 5\' 6"  (1.676 m)  Wt 132 lb (59.875 kg)  BMI 21.32 kg/m2 Wt Readings from Last 3 Encounters:  04/05/16 132 lb (59.875 kg)  02/09/16 140 lb (63.504 kg)  11/24/15 142 lb (64.411 kg)          Assessment & Plan:  DDD (degenerative disc disease), lumbosacral Neck pain, chronic --- [facility]  Anxiety --- Continue clonazepam and Celexa  Functional diarrhea--secondary to medication --- This does not respond probiotics, dietary changes, and intermittent Lomotil over the next few weeks then she should be evaluated by gastroenterology. There has been no fever, somatic dysfunction to suggest underlying CDiff    Meds ordered this encounter  Medications  . methocarbamol (ROBAXIN) 500 MG tablet    Sig: Take 500 mg by mouth 4 (four) times  daily.  . citalopram (CELEXA) 20 MG tablet    Sig: Take 1 tablet (20 mg total) by mouth at bedtime.    Dispense:  90 tablet    Refill:  3  . clonazePAM (KLONOPIN) 1 MG tablet    Sig: Take 1 tablet (1 mg total) by mouth 2 (two) times daily as needed for anxiety.    Dispense:  30 tablet    Refill:  5  . diphenoxylate-atropine (LOMOTIL) 2.5-0.025 MG tablet    Sig: Take 1 tablet by mouth 4 (four) times daily as needed for diarrhea or loose stools.    Dispense:  30 tablet    Refill:  1    We discussed follow-up possibilities for primary care

## 2016-04-05 NOTE — Patient Instructions (Signed)
     IF you received an x-ray today, you will receive an invoice from [facility]. Please contact [facility] at [phone] with questions or concerns regarding your invoice.   IF you received labwork today, you will receive an invoice from [facility]. Please contact [facility] at [phone] with questions or concerns regarding your invoice.   Our billing staff will not be able to assist you with questions regarding bills from these companies.  You will be contacted with the lab results as soon as they are available. The fastest way to get your results is to activate your My Chart account. Instructions are located on the last page of this paperwork. If you have not heard from us regarding the results in 2 weeks, please contact this office.      

## 2016-04-18 ENCOUNTER — Telehealth: Payer: Self-pay

## 2016-04-18 DIAGNOSIS — K591 Functional diarrhea: Secondary | ICD-10-CM

## 2016-04-18 NOTE — Telephone Encounter (Signed)
Pt states she was given medication for Diarrhea but need an Antibiotic called in. Pt was advised she needed to come in and be seen but she stated the Dr told her he would call One in if needed. Please call [phone]     [place]

## 2016-04-19 NOTE — Telephone Encounter (Signed)
I don't see that you planned Abx in your OV notes. It looked like you were going to refer to GI if Sxs didn't resolve w/other meds Rxd. I do see that Cdiff was discussed though. Please advise.

## 2016-04-21 NOTE — Telephone Encounter (Signed)
I have referred to GI, per Dr. Netta Corriganoolittle's plan. He did not mention starting her on an antibiotic. If she feels her symptoms are worsening and doesn't think she can wait for GI appt, she should return here for further eval.

## 2016-04-21 NOTE — Telephone Encounter (Signed)
Patient is requesting an antibiotic. She understands Dr. Merla Richesoolittle is out of town. She need some medication. She is having extreme diarrhea, burning, cramping, and nausea. Patient stated symptoms are getting worse. [phone].

## 2016-04-23 NOTE — Telephone Encounter (Signed)
I spoke to patient she is very upset no one has called her sooner. I have told her to come in / apologized for delay, explained not appropriate to start new meds over the phone, she understands but is very upset Dr Merla Richesoolittle is retiring and there was a delay

## 2016-04-23 NOTE — Telephone Encounter (Signed)
Pt called to check the status of requested call back... Shared info with pt about needing to come in if appointment with GI is too far away// pt asked to speak with someone transferred to clinical team

## 2016-10-29 ENCOUNTER — Telehealth: Payer: Self-pay | Admitting: Family Medicine

## 2016-10-29 NOTE — Telephone Encounter (Signed)
Phone call from answering service approximately 11:15 AM. Apparently out of a prescription "Beddick RPH".  Verified spelling with answering service, unable to identify this medication. Called patient, went straight to VM - voicemail left that I would try to reach her again later. Attempted phone call again in approximately 4 PM, again went straight to voicemail. Advised her on voicemail to have her pharmacy call us with specific refill needed or to call our office to clarify which medication needs refilled.

## 2016-10-31 ENCOUNTER — Other Ambulatory Visit: Payer: Self-pay | Admitting: Family Medicine

## 2016-10-31 ENCOUNTER — Ambulatory Visit: Payer: BLUE CROSS/BLUE SHIELD

## 2016-10-31 NOTE — Telephone Encounter (Signed)
03/2016 last ov doolittle

## 2016-10-31 NOTE — Telephone Encounter (Signed)
Patient is calling back to cancel her appointment  She needs her medications (see previous message)

## 2016-10-31 NOTE — Telephone Encounter (Signed)
Pt has a balance that need to be paid she has an appointment today but need these medicines refilled ASAP he states that she's sweating badly please [facility] she will try and get daughter get daughter to pay off balance she need to establish care with  Dr Neva SeatGreene asap medicines need refill on is ESTRADIOL and PROVERA

## 2016-11-02 MED ORDER — MEDROXYPROGESTERONE ACETATE 2.5 MG PO TABS: 2.5000 mg | ORAL_TABLET | Freq: Every day | ORAL | 0 refills | Status: DC

## 2016-11-02 MED ORDER — ESTRADIOL 1 MG PO TABS: 1.0000 mg | ORAL_TABLET | Freq: Every day | ORAL | 0 refills | Status: DC

## 2016-11-02 NOTE — Telephone Encounter (Signed)
I gave her a month - she will need to see Dr Neva SeatGreene before they run out.

## 2016-11-03 NOTE — Telephone Encounter (Signed)
Spoke with pt and advised she needs appt prior to further refills.

## 2016-11-20 ENCOUNTER — Ambulatory Visit (INDEPENDENT_AMBULATORY_CARE_PROVIDER_SITE_OTHER): Payer: BLUE CROSS/BLUE SHIELD | Admitting: Family Medicine

## 2016-11-20 VITALS — BP 124/78 | HR 88 | Temp 98.4°F | Resp 17 | Ht 66.0 in | Wt 134.0 lb

## 2016-11-20 DIAGNOSIS — Z131 Encounter for screening for diabetes mellitus: Secondary | ICD-10-CM

## 2016-11-20 DIAGNOSIS — M5136 Other intervertebral disc degeneration, lumbar region: Secondary | ICD-10-CM

## 2016-11-20 DIAGNOSIS — R12 Heartburn: Secondary | ICD-10-CM | POA: Diagnosis not present

## 2016-11-20 DIAGNOSIS — M792 Neuralgia and neuritis, unspecified: Secondary | ICD-10-CM

## 2016-11-20 MED ORDER — OMEPRAZOLE 20 MG PO CPDR: 20.0000 mg | DELAYED_RELEASE_CAPSULE | Freq: Every day | ORAL | 0 refills | Status: DC

## 2016-11-20 NOTE — Patient Instructions (Addendum)
I will check a tests for vitamin B12 deficiency, thyroid tests, as well as a diabetes test to look into causes of the burning pain in your feet. However if those are all normal, I would recommend discussing the symptoms with your surgeon as that may be due to your back issues. If you did not tolerate gabapentin, Lyrica may also be an option, but please discuss that with your orthopedist and physical medicine rehabilitation specialist.  A gurgling sound in your throat may be due to heartburn. See information below on foods to avoid, and if that does not help, I also wrote omeprazole one pill once per day to help block acid. If the symptoms are not improving with that treatment, return to discuss other causes, or sooner if worse.  Please schedule appointment to discuss your other prescription medications.  .Return to the clinic or go to the nearest emergency room if any of your symptoms worsen or new symptoms occur.   Peripheral Neuropathy Peripheral neuropathy is a type of nerve damage. It affects nerves that carry signals between the spinal cord and other parts of the body. These are called peripheral nerves. With peripheral neuropathy, one nerve or a group of nerves may be damaged. What are the causes? Many things can damage peripheral nerves. For some people with peripheral neuropathy, the cause is unknown. Some causes include:  Diabetes. This is the most common cause of peripheral neuropathy.  Injury to a nerve.  Pressure or stress on a nerve that lasts a long time.  Too little vitamin B. Alcoholism can lead to this.  Infections.  Autoimmune diseases, such as multiple sclerosis and systemic lupus erythematosus.  Inherited nerve diseases.  Some medicines, such as cancer drugs.  Toxic substances, such as lead and mercury.  Too little blood flowing to the legs.  Kidney disease.  Thyroid disease. What are the signs or symptoms? Different people have different symptoms. The  symptoms you have will depend on which of your nerves is damaged. Common symptoms include:  Loss of feeling (numbness) in the feet and hands.  Tingling in the feet and hands.  Pain that burns.  Very sensitive skin.  Weakness.  Not being able to move a part of the body (paralysis).  Muscle twitching.  Clumsiness or poor coordination.  Loss of balance.  Not being able to control your bladder.  Feeling dizzy.  Sexual problems. How is this diagnosed? Peripheral neuropathy is a symptom, not a disease. Finding the cause of peripheral neuropathy can be hard. To figure that out, your health care provider will take a medical history and do a physical exam. A neurological exam will also be done. This involves checking things affected by your brain, spinal cord, and nerves (nervous system). For example, your health care provider will check your reflexes, how you move, and what you can feel. Other types of tests may also be ordered, such as:  Blood tests.  A test of the fluid in your spinal cord.  Imaging tests, such as CT scans or an MRI.  Electromyography (EMG). This test checks the nerves that control muscles.  Nerve conduction velocity tests. These tests check how fast messages pass through your nerves.  Nerve biopsy. A small piece of nerve is removed. It is then checked under a microscope. How is this treated?  Medicine is often used to treat peripheral neuropathy. Medicines may include:  Pain-relieving medicines. Prescription or over-the-counter medicine may be suggested.  Antiseizure medicine. This may be used for pain.  Antidepressants. These also may help ease pain from neuropathy.  Lidocaine. This is a numbing medicine. You might wear a patch or be given a shot.  Mexiletine. This medicine is typically used to help control irregular heart rhythms.  Surgery. Surgery may be needed to relieve pressure on a nerve or to destroy a nerve that is causing pain.  Physical  therapy to help movement.  Assistive devices to help movement. Follow these instructions at home:  Only take over-the-counter or prescription medicines as directed by your health care provider. Follow the instructions carefully for any given medicines. Do not take any other medicines without first getting approval from your health care provider.  If you have diabetes, work closely with your health care provider to keep your blood sugar under control.  If you have numbness in your feet:  Check every day for signs of injury or infection. Watch for redness, warmth, and swelling.  Wear padded socks and comfortable shoes. These help protect your feet.  Do not do things that put pressure on your damaged nerve.  Do not smoke. Smoking keeps blood from getting to damaged nerves.  Avoid or limit alcohol. Too much alcohol can cause a lack of B vitamins. These vitamins are needed for healthy nerves.  Develop a good support system. Coping with peripheral neuropathy can be stressful. Talk to a mental health specialist or join a support group if you are struggling.  Follow up with your health care provider as directed. Contact a health care provider if:  You have new signs or symptoms of peripheral neuropathy.  You are struggling emotionally from dealing with peripheral neuropathy.  You have a fever. Get help right away if:  You have an injury or infection that is not healing.  You feel very dizzy or begin vomiting.  You have chest pain.  You have trouble breathing. This information is not intended to replace advice given to you by your health care provider. Make sure you discuss any questions you have with your health care provider. Document Released: 12/01/2002 Document Revised: 05/18/2016 Document Reviewed: 08/18/2013 Elsevier Interactive Patient Education  2017 [employer]   Food Choices for Gastroesophageal Reflux Disease, Adult When you have gastroesophageal reflux disease  (GERD), the foods you eat and your eating habits are very important. Choosing the right foods can help ease the discomfort of GERD. What general guidelines do I need to follow?  Choose fruits, vegetables, whole grains, low-fat dairy products, and low-fat meat, fish, and poultry.  Limit fats such as oils, salad dressings, butter, nuts, and avocado.  Keep a food diary to identify foods that cause symptoms.  Avoid foods that cause reflux. These may be different for different people.  Eat frequent small meals instead of three large meals each day.  Eat your meals slowly, in a relaxed setting.  Limit fried foods.  Cook foods using methods other than frying.  Avoid drinking alcohol.  Avoid drinking large amounts of liquids with your meals.  Avoid bending over or lying down until 2-3 hours after eating. What foods are not recommended? The following are some foods and drinks that may worsen your symptoms: Vegetables  Tomatoes. Tomato juice. Tomato and spaghetti sauce. Chili peppers. Onion and garlic. Horseradish. Fruits  Oranges, grapefruit, and lemon (fruit and juice). Meats  High-fat meats, fish, and poultry. This includes hot dogs, ribs, ham, sausage, salami, and bacon. Dairy  Whole milk and chocolate milk. Sour cream. Cream. Butter. Ice cream. Cream cheese. Beverages  Coffee and tea,  with or without caffeine. Carbonated beverages or energy drinks. Condiments  Hot sauce. Barbecue sauce. Sweets/Desserts  Chocolate and cocoa. Donuts. Peppermint and spearmint. Fats and Oils  High-fat foods, including [place] fries and potato chips. Other  Vinegar. Strong spices, such as black pepper, white pepper, red pepper, cayenne, curry powder, cloves, ginger, and chili powder. The items listed above may not be a complete list of foods and beverages to avoid. Contact your dietitian for more information.  This information is not intended to replace advice given to you by your health care  provider. Make sure you discuss any questions you have with your health care provider. Document Released: 12/11/2005 Document Revised: 05/18/2016 Document Reviewed: 10/15/2013 Elsevier Interactive Patient Education  2017 [employer].    IF you received an x-ray today, you will receive an invoice from [facility]. Please contact [facility] at [phone] with questions or concerns regarding your invoice.   IF you received labwork today, you will receive an invoice from [employer]. Please contact [facility] at [phone] with questions or concerns regarding your invoice.   Our billing staff will not be able to assist you with questions regarding bills from these companies.  You will be contacted with the lab results as soon as they are available. The fastest way to get your results is to activate your My Chart account. Instructions are located on the last page of this paperwork. If you have not heard from [place] regarding the results in 2 weeks, please contact this office.    We recommend that you schedule a mammogram for breast cancer screening. Typically, you do not need a referral to do this. Please contact a local imaging center to schedule your mammogram.  [facility] - [phone]  *ask for the Radiology Department [facility]) - [phone] or [phone]  [facility] - [phone] [facility] - [phone] [facility] - [phone]  *ask for the Radiology Department [facility] - [phone]  *ask for the Radiology Department [facility] - [phone]  *ask for the Mammography Department [facility] - [phone]
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
## 2016-12-09 ENCOUNTER — Other Ambulatory Visit: Payer: Self-pay | Admitting: Family Medicine

## 2016-12-13 MED ORDER — MEDROXYPROGESTERONE ACETATE 2.5 MG PO TABS: 2.5000 mg | ORAL_TABLET | Freq: Every day | ORAL | 0 refills | Status: DC

## 2016-12-13 MED ORDER — ESTRADIOL 1 MG PO TABS: 1.0000 mg | ORAL_TABLET | Freq: Every day | ORAL | 0 refills | Status: DC

## 2016-12-13 NOTE — Telephone Encounter (Signed)
Unfortunately we did not discuss that at her last visit, but she was there to establish care. I can refill it rarely, but please follow-up with me in the next 3 months to discuss that medication further.

## 2016-12-13 NOTE — Addendum Note (Signed)
Addended by: Sheppard PlumberBRIGGS, Naliya Gish A on: 12/13/2016 01:21 PM   Modules accepted: Orders

## 2016-12-13 NOTE — Addendum Note (Signed)
Addended by: Meredith StaggersGREENE, Ariza Evans R on: 12/13/2016 05:08 PM   Modules accepted: Orders

## 2016-12-13 NOTE — Telephone Encounter (Signed)
Pt called and didn't understand why her RFs were denied when she was just in here for a check up. I had Corrie DandyMary explain our protocol that if she hasn't been seen for a particular Dx assoc with the medication, we can not refill unless the provider OKs it. Dr Neva SeatGreene, do you want to RF these two meds, or have pt come back in to discuss them first?

## 2016-12-15 NOTE — Telephone Encounter (Signed)
LMOM advising pt that Dr Neva SeatGreene sent in 90 day RFs of each med and she should plan to RTC w/in that time to discuss these meds and any others that they hadn't had a chance to discuss at first OV.

## 2016-12-16 ENCOUNTER — Encounter: Payer: Self-pay | Admitting: Physician Assistant

## 2016-12-16 ENCOUNTER — Ambulatory Visit (INDEPENDENT_AMBULATORY_CARE_PROVIDER_SITE_OTHER): Payer: BLUE CROSS/BLUE SHIELD | Admitting: Physician Assistant

## 2016-12-16 VITALS — BP 128/80 | HR 73 | Temp 98.7°F | Resp 16 | Ht 64.0 in | Wt 134.0 lb

## 2016-12-16 DIAGNOSIS — Z13228 Encounter for screening for other metabolic disorders: Secondary | ICD-10-CM

## 2016-12-16 DIAGNOSIS — R251 Tremor, unspecified: Secondary | ICD-10-CM

## 2016-12-16 DIAGNOSIS — Z1231 Encounter for screening mammogram for malignant neoplasm of breast: Secondary | ICD-10-CM

## 2016-12-16 DIAGNOSIS — E559 Vitamin D deficiency, unspecified: Secondary | ICD-10-CM | POA: Diagnosis not present

## 2016-12-16 DIAGNOSIS — Z130 Encounter for screening for diseases of the blood and blood-forming organs and certain disorders involving the immune mechanism: Secondary | ICD-10-CM | POA: Diagnosis not present

## 2016-12-16 DIAGNOSIS — R208 Other disturbances of skin sensation: Secondary | ICD-10-CM

## 2016-12-16 DIAGNOSIS — N951 Menopausal and female climacteric states: Secondary | ICD-10-CM

## 2016-12-16 DIAGNOSIS — Z13220 Encounter for screening for lipoid disorders: Secondary | ICD-10-CM

## 2016-12-16 DIAGNOSIS — Z1211 Encounter for screening for malignant neoplasm of colon: Secondary | ICD-10-CM | POA: Diagnosis not present

## 2016-12-16 DIAGNOSIS — F458 Other somatoform disorders: Secondary | ICD-10-CM

## 2016-12-16 DIAGNOSIS — R198 Other specified symptoms and signs involving the digestive system and abdomen: Secondary | ICD-10-CM

## 2016-12-16 DIAGNOSIS — R5383 Other fatigue: Secondary | ICD-10-CM

## 2016-12-16 DIAGNOSIS — R0989 Other specified symptoms and signs involving the circulatory and respiratory systems: Secondary | ICD-10-CM

## 2016-12-16 DIAGNOSIS — Z0000 Encounter for general adult medical examination without abnormal findings: Secondary | ICD-10-CM | POA: Diagnosis not present

## 2016-12-16 LAB — POCT URINALYSIS DIP (MANUAL ENTRY)
Bilirubin, UA: NEGATIVE
Glucose, UA: NEGATIVE
Ketones, POC UA: NEGATIVE
Leukocytes, UA: NEGATIVE
Nitrite, UA: NEGATIVE
Protein Ur, POC: NEGATIVE
Spec Grav, UA: 1.015
Urobilinogen, UA: 0.2
pH, UA: 6

## 2016-12-16 MED ORDER — METOPROLOL TARTRATE 25 MG PO TABS: 25.0000 mg | ORAL_TABLET | Freq: Every day | ORAL | 1 refills | Status: DC

## 2016-12-16 MED ORDER — MEDROXYPROGESTERONE ACETATE 2.5 MG PO TABS: 2.5000 mg | ORAL_TABLET | Freq: Every day | ORAL | 0 refills | Status: DC

## 2016-12-16 MED ORDER — ESTRADIOL 1 MG PO TABS: 1.0000 mg | ORAL_TABLET | Freq: Every day | ORAL | 0 refills | Status: DC

## 2016-12-16 NOTE — Progress Notes (Signed)
DESERI LOSS  MRN: [id number] DOB: [birth year]  Subjective:  Pt presents to clinic for a CPE.  Throat - gurgling noises - feels full - only when she lays down - prilosec - did not take regularly - took only once but she stopped because she did not think that would help.  She has no trouble breathing and no trouble with swallowing foods or liquids - happens every time she lays down. She does not get heartburn but does sometimes wake up with sour/bitter taste in mouth in the am.  She eats before bed frequently.  Last dental exam: knows she needs - not in her budget Last vision exam: wears glasses - 2 years ago Last pap smear: 2 years - Dr Harrington Challenger - normal Last mammogram: 4 years ago - [facility] with [place] -  Last colonoscopy: 2 in the past - Dr Collene Mares and with [facility] Heckler - unsure when the last one was Vaccinations      Tetanus - 2024      Zostavax -   working 3 afternoon at [employer] - on her feet all the time - was recommended by her back specialist that this might not be the best job for her but she needs to work  Anxiety - has been on klonopin but her pain specialist told her she cannot be on with the pain medication - she stopped taking it and does not want to continue nor does she want to start something different for her anxiety.  Dr Nelva Bush - treats back pain Dr Tonita Cong - back surgery   Patient Active Problem List   Diagnosis Date Noted  . Spinal stenosis of lumbar region 11/24/2015  . HNP (herniated nucleus pulposus), lumbar 12/23/2014  . DDD (degenerative disc disease), lumbosacral 08/30/2014  . Lumbar radicular pain 08/30/2014  . Sciatica of left side 03/24/2014  . Neck pain 04/07/2012  . Neck pain, chronic 04/07/2012  . Tremor - metoprolol has helped in the past and she would like another Rx 04/07/2012  . Anxiety 04/07/2012  . Palpitations 06/17/2011  . Carpal tunnel syndrome   . Idiopathic edema   . History of shingles     Current Outpatient Prescriptions on File Prior  to Visit  Medication Sig Dispense Refill  . clonazePAM (KLONOPIN) 1 MG tablet Take 1 tablet (1 mg total) by mouth 2 (two) times daily as needed for anxiety. 30 tablet 5  . HYDROcodone-acetaminophen (NORCO) 10-325 MG tablet Take 1 tablet by mouth every 6 (six) hours as needed.    Marland Kitchen ibuprofen (ADVIL,MOTRIN) 800 MG tablet Take 1 tablet (800 mg total) by mouth daily. May resume 5 days post op. With food 30 tablet 0  . methocarbamol (ROBAXIN) 500 MG tablet Take 500 mg by mouth 4 (four) times daily.    . ondansetron (ZOFRAN) 8 MG tablet Take 1 tablet (8 mg total) by mouth every 8 (eight) hours as needed for nausea or vomiting. 40 tablet 0   No current facility-administered medications on file prior to visit.     Allergies  Allergen Reactions  . Meloxicam Shortness Of Breath and Swelling    Swelling of throat Tolerates ibuprofen, diclofenac    Social History   Social History  . Marital status: Single    Spouse name: N/A  . Number of children: 1  . Years of education: N/A   Occupational History  .      part time   Social History Main Topics  . Smoking status: Never Smoker  .  Smokeless tobacco: Never Used  . Alcohol use No     Comment: "now and then"  . Drug use: No  . Sexual activity: Yes    Birth control/ protection: None   Other Topics Concern  . None   Social History Narrative   Live alone   daugther      Seatbelt - 100%   No guns in home    Past Surgical History:  Procedure Laterality Date  . CESAREAN SECTION    . colonscopy      x 3  . LUMBAR LAMINECTOMY/DECOMPRESSION MICRODISCECTOMY Right 12/23/2014   Procedure: MICRO LUMBAR DECOMPRESSION L5 - S1 ON THE RIGHT    (LEVEL1)    ;  Surgeon: [name], MD;  Location: [facility];  Service: Orthopedics;  Laterality: Right;  . LUMBAR LAMINECTOMY/DECOMPRESSION MICRODISCECTOMY Right 11/24/2015   Procedure: MICRO LUMBER DECOMPRESSION L5-S1 ON RIGHT REMOVAL OF SYNOVIAL CYST;  Surgeon: [name], MD;  Location: [facility];   Service: Orthopedics;  Laterality: Right;  . UPPER GI ENDOSCOPY     x 2    Family History  Problem Relation Age of Onset  . Asthma Mother   . Thyroid disease Mother   . Diabetes Mother   . Stroke Mother   . Diabetes Father   . Colon cancer Sister 63  . Hypertension Brother   . Thyroid disease Sister   . Asthma Sister   . Breast cancer Sister   . Colon cancer Sister   . Colon cancer Maternal Grandmother   . Colon cancer Paternal Aunt     throat  . Colon cancer Paternal Uncle     stomach  . Colon cancer Paternal Aunt     kidney    Review of Systems  Constitutional: Negative.   HENT: Negative.   Eyes: Negative.   Respiratory: Negative.  Negative for cough and shortness of breath.   Cardiovascular: Negative.  Negative for chest pain.  Gastrointestinal: Negative.  Negative for abdominal pain.  Endocrine: Negative.   Genitourinary: Negative.   Musculoskeletal: Negative.   Skin: Negative.   Allergic/Immunologic: Negative.   Neurological: Negative.   Hematological: Negative.   Psychiatric/Behavioral: The patient is nervous/anxious.     Objective:  BP 128/80 (BP Location: Right Arm, Patient Position: Sitting, Cuff Size: Normal)   Pulse 73   Temp 98.7 F (37.1 C) (Oral)   Resp 16   Ht 5\' 4"  (1.626 m)   Wt 134 lb (60.8 kg)   SpO2 97%   BMI 23.00 kg/m   Physical Exam  Constitutional: She is oriented to person, place, and time and well-developed, well-nourished, and in no distress.  HENT:  Head: Normocephalic and atraumatic.  Right Ear: Hearing, tympanic membrane, external ear and ear canal normal.  Left Ear: Hearing, tympanic membrane, external ear and ear canal normal.  Nose: Nose normal.  Mouth/Throat: Uvula is midline, oropharynx is clear and moist and mucous membranes are normal.  Eyes: Conjunctivae and EOM are normal. Pupils are equal, round, and reactive to light.  Neck: Trachea normal and normal range of motion. Neck supple. No thyroid mass and no  thyromegaly present.  Cardiovascular: Normal rate, regular rhythm and normal heart sounds.   No murmur heard. Pulmonary/Chest: Effort normal and breath sounds normal. She has no wheezes.  Abdominal: Soft. Bowel sounds are normal. There is no tenderness.  Musculoskeletal: Normal range of motion.  Lymphadenopathy:    She has no cervical adenopathy.  Neurological: She is alert and oriented to person, place,  and time. She has normal motor skills, normal sensation, normal strength and normal reflexes. Gait normal.  Skin: Skin is warm and dry.  Psychiatric: Mood, memory, affect and judgment normal.    Visual Acuity Screening   Right eye Left eye Both eyes  Without correction:     With correction: '20/25 20/20 20/15 '$    Assessment and Plan :  Annual physical exam - Plan: POCT urinalysis dipstick  Screening for metabolic disorder - Plan: CMP14+EGFR  Screening for deficiency anemia - Plan: CBC with Differential/Platelet  Screening, lipid - Plan: Lipid panel  Globus sensation - Plan: TSH, Ambulatory referral to Gastroenterology - likely this is reflux but the patient would like a referral to a specialist and likely an endoscopy will be helpful   Colon cancer screening - Plan: IFOBT POC (occult bld, rslt in office) -  Tremor - Plan: metoprolol tartrate (LOPRESSOR) 25 MG tablet - pt has had in the past and this has worked well - she will be mindful of her BP on this medication  Post menopausal syndrome - Plan: estradiol (ESTRACE) 1 MG tablet, medroxyPROGESTERone (PROVERA) 2.5 MG tablet - I have continued this medication as her GYN has retired - she needs to make an appt to have a mammogram  Visit for screening mammogram  Burning sensation of feet - Plan: Vitamin B12 - check labs though expect this to be related to standing and her need for good supportive shoes.  Fatigue, unspecified type - Plan: VITAMIN D 25 Hydroxy (Vit-D Deficiency, Fractures) - check labs though likely related to anxiety  and possible pain medications.  Vitamin D deficiency - Plan: Vitamin D, Ergocalciferol, (DRISDOL) 50000 units CAPS capsule  Windell Hummingbird PA-C  [facility] 12/20/2016 7:18 AM

## 2016-12-16 NOTE — Patient Instructions (Addendum)
Health Maintenance, Female Introduction Adopting a healthy lifestyle and getting preventive care can go a long way to promote health and wellness. Talk with your health care provider about what schedule of regular examinations is right for you. This is a good chance for you to check in with your provider about disease prevention and staying healthy. In between checkups, there are plenty of things you can do on your own. Experts have done a lot of research about which lifestyle changes and preventive measures are most likely to keep you healthy. Ask your health care provider for more information. Weight and diet Eat a healthy diet  Be sure to include plenty of vegetables, fruits, low-fat dairy products, and lean protein.  Do not eat a lot of foods high in solid fats, added sugars, or salt.  Get regular exercise. This is one of the most important things you can do for your health.  Most adults should exercise for at least 150 minutes each week. The exercise should increase your heart rate and make you sweat (moderate-intensity exercise).  Most adults should also do strengthening exercises at least twice a week. This is in addition to the moderate-intensity exercise. Maintain a healthy weight  Body mass index (BMI) is a measurement that can be used to identify possible weight problems. It estimates body fat based on height and weight. Your health care provider can help determine your BMI and help you achieve or maintain a healthy weight.  For females 63 years of age and older:  A BMI below 18.5 is considered underweight.  A BMI of 18.5 to 24.9 is normal.  A BMI of 25 to 29.9 is considered overweight.  A BMI of 30 and above is considered obese. Watch levels of cholesterol and blood lipids  You should start having your blood tested for lipids and cholesterol at 63 years of age, then have this test every 5 years.  You may need to have your cholesterol levels checked more often if:  Your  lipid or cholesterol levels are high.  You are older than 63 years of age.  You are at high risk for heart disease. Cancer screening Lung Cancer  Lung cancer screening is recommended for adults 56-22 years old who are at high risk for lung cancer because of a history of smoking.  A yearly low-dose CT scan of the lungs is recommended for people who:  Currently smoke.  Have quit within the past 15 years.  Have at least a 30-pack-year history of smoking. A pack year is smoking an average of one pack of cigarettes a day for 1 year.  Yearly screening should continue until it has been 15 years since you quit.  Yearly screening should stop if you develop a health problem that would prevent you from having lung cancer treatment. Breast Cancer  Practice breast self-awareness. This means understanding how your breasts normally appear and feel.  It also means doing regular breast self-exams. Let your health care provider know about any changes, no matter how small.  If you are in your 20s or 30s, you should have a clinical breast exam (CBE) by a health care provider every 1-3 years as part of a regular health exam.  If you are 35 or older, have a CBE every year. Also consider having a breast X-ray (mammogram) every year.  If you have a family history of breast cancer, talk to your health care provider about genetic screening.  If you are at high risk for breast cancer,  talk to your health care provider about having an MRI and a mammogram every year.  Breast cancer gene (BRCA) assessment is recommended for women who have family members with BRCA-related cancers. BRCA-related cancers include:  Breast.  Ovarian.  Tubal.  Peritoneal cancers.  Results of the assessment will determine the need for genetic counseling and BRCA1 and BRCA2 testing. Cervical Cancer  Your health care provider may recommend that you be screened regularly for cancer of the pelvic organs (ovaries, uterus, and  vagina). This screening involves a pelvic examination, including checking for microscopic changes to the surface of your cervix (Pap test). You may be encouraged to have this screening done every 3 years, beginning at age 21.  For women ages 30-65, health care providers may recommend pelvic exams and Pap testing every 3 years, or they may recommend the Pap and pelvic exam, combined with testing for human papilloma virus (HPV), every 5 years. Some types of HPV increase your risk of cervical cancer. Testing for HPV may also be done on women of any age with unclear Pap test results.  Other health care providers may not recommend any screening for nonpregnant women who are considered low risk for pelvic cancer and who do not have symptoms. Ask your health care provider if a screening pelvic exam is right for you.  If you have had past treatment for cervical cancer or a condition that could lead to cancer, you need Pap tests and screening for cancer for at least 20 years after your treatment. If Pap tests have been discontinued, your risk factors (such as having a new sexual partner) need to be reassessed to determine if screening should resume. Some women have medical problems that increase the chance of getting cervical cancer. In these cases, your health care provider may recommend more frequent screening and Pap tests. Colorectal Cancer  This type of cancer can be detected and often prevented.  Routine colorectal cancer screening usually begins at 63 years of age and continues through 63 years of age.  Your health care provider may recommend screening at an earlier age if you have risk factors for colon cancer.  Your health care provider may also recommend using home test kits to check for hidden blood in the stool.  A small camera at the end of a tube can be used to examine your colon directly (sigmoidoscopy or colonoscopy). This is done to check for the earliest forms of colorectal  cancer.  Routine screening usually begins at age 50.  Direct examination of the colon should be repeated every 5-10 years through 63 years of age. However, you may need to be screened more often if early forms of precancerous polyps or small growths are found. Skin Cancer  Check your skin from head to toe regularly.  Tell your health care provider about any new moles or changes in moles, especially if there is a change in a mole's shape or color.  Also tell your health care provider if you have a mole that is larger than the size of a pencil eraser.  Always use sunscreen. Apply sunscreen liberally and repeatedly throughout the day.  Protect yourself by wearing long sleeves, pants, a wide-brimmed hat, and sunglasses whenever you are outside. Heart disease, diabetes, and high blood pressure  High blood pressure causes heart disease and increases the risk of stroke. High blood pressure is more likely to develop in:  People who have blood pressure in the high end of the normal range (130-139/85-89 mm Hg).    People who are overweight or obese.  People who are African American.  If you are 18-39 years of age, have your blood pressure checked every 3-5 years. If you are 40 years of age or older, have your blood pressure checked every year. You should have your blood pressure measured twice-once when you are at a hospital or clinic, and once when you are not at a hospital or clinic. Record the average of the two measurements. To check your blood pressure when you are not at a hospital or clinic, you can use:  An automated blood pressure machine at a pharmacy.  A home blood pressure monitor.  If you are between 55 years and 79 years old, ask your health care provider if you should take aspirin to prevent strokes.  Have regular diabetes screenings. This involves taking a blood sample to check your fasting blood sugar level.  If you are at a normal weight and have a low risk for diabetes,  have this test once every three years after 63 years of age.  If you are overweight and have a high risk for diabetes, consider being tested at a younger age or more often. Preventing infection Hepatitis B  If you have a higher risk for hepatitis B, you should be screened for this virus. You are considered at high risk for hepatitis B if:  You were born in a country where hepatitis B is common. Ask your health care provider which countries are considered high risk.  Your parents were born in a high-risk country, and you have not been immunized against hepatitis B (hepatitis B vaccine).  You have HIV or AIDS.  You use needles to inject street drugs.  You live with someone who has hepatitis B.  You have had sex with someone who has hepatitis B.  You get hemodialysis treatment.  You take certain medicines for conditions, including cancer, organ transplantation, and autoimmune conditions. Hepatitis C  Blood testing is recommended for:  Everyone born from 1945 through 1965.  Anyone with known risk factors for hepatitis C. Sexually transmitted infections (STIs)  You should be screened for sexually transmitted infections (STIs) including gonorrhea and chlamydia if:  You are sexually active and are younger than 63 years of age.  You are older than 63 years of age and your health care provider tells you that you are at risk for this type of infection.  Your sexual activity has changed since you were last screened and you are at an increased risk for chlamydia or gonorrhea. Ask your health care provider if you are at risk.  If you do not have HIV, but are at risk, it may be recommended that you take a prescription medicine daily to prevent HIV infection. This is called pre-exposure prophylaxis (PrEP). You are considered at risk if:  You are sexually active and do not regularly use condoms or know the HIV status of your partner(s).  You take drugs by injection.  You are sexually  active with a partner who has HIV. Talk with your health care provider about whether you are at high risk of being infected with HIV. If you choose to begin PrEP, you should first be tested for HIV. You should then be tested every 3 months for as long as you are taking PrEP. Pregnancy  If you are premenopausal and you may become pregnant, ask your health care provider about preconception counseling.  If you may become pregnant, take 400 to 800 micrograms (mcg) of folic acid   every day.  If you want to prevent pregnancy, talk to your health care provider about birth control (contraception). Osteoporosis and menopause  Osteoporosis is a disease in which the bones lose minerals and strength with aging. This can result in serious bone fractures. Your risk for osteoporosis can be identified using a bone density scan.  If you are 64 years of age or older, or if you are at risk for osteoporosis and fractures, ask your health care provider if you should be screened.  Ask your health care provider whether you should take a calcium or vitamin D supplement to lower your risk for osteoporosis.  Menopause may have certain physical symptoms and risks.  Hormone replacement therapy may reduce some of these symptoms and risks. Talk to your health care provider about whether hormone replacement therapy is right for you. Follow these instructions at home:  Schedule regular health, dental, and eye exams.  Stay current with your immunizations.  Do not use any tobacco products including cigarettes, chewing tobacco, or electronic cigarettes.  If you are pregnant, do not drink alcohol.  If you are breastfeeding, limit how much and how often you drink alcohol.  Limit alcohol intake to no more than 1 drink per day for nonpregnant women. One drink equals 12 ounces of beer, 5 ounces of wine, or 1 ounces of hard liquor.  Do not use street drugs.  Do not share needles.  Ask your health care provider for  help if you need support or information about quitting drugs.  Tell your health care provider if you often feel depressed.  Tell your health care provider if you have ever been abused or do not feel safe at home. This information is not intended to replace advice given to you by your health care provider. Make sure you discuss any questions you have with your health care provider. Document Released: 06/26/2011 Document Revised: 05/18/2016 Document Reviewed: 09/14/2015  2017 Elsevier    We recommend that you schedule a mammogram for breast cancer screening. Typically, you do not need a referral to do this. Please contact a local imaging center to schedule your mammogram.  [facility] - [phone]  *ask for the Radiology Department [facility] ([place]) - [phone] or [phone]  [facility] - [phone] [place] [phone] [facility] - [phone]  *ask for the [facility] - [phone]  *ask for the Radiology Department [facility] - [phone]  *ask for the [facility] - [phone]    IF you received an x-ray today, you will receive an invoice from [facility]. Please contact [facility] at [phone] with questions or concerns regarding your invoice.   IF you received labwork today, you will receive an invoice from [facility]. Please contact [facility] at [phone] with questions or concerns regarding your invoice.   Our billing staff will not be able to assist you with questions regarding bills from these companies.  You will be contacted with the lab results as soon as they are available. The fastest way to get your results is to activate your My Chart account. Instructions are located on the last page of this paperwork. If you have not heard from [place] regarding the results in 2 weeks, please  contact this office.

## 2016-12-19 LAB — CMP14+EGFR
ALT: 15 IU/L (ref 0–32)
AST: 23 IU/L (ref 0–40)
Albumin/Globulin Ratio: 1.8 (ref 1.2–2.2)
Albumin: 4.5 g/dL (ref 3.6–4.8)
Alkaline Phosphatase: 84 IU/L (ref 39–117)
BUN/Creatinine Ratio: 11 — ABNORMAL LOW (ref 12–28)
BUN: 9 mg/dL (ref 8–27)
Bilirubin Total: 0.3 mg/dL (ref 0.0–1.2)
CO2: 25 mmol/L (ref 18–29)
Calcium: 9.4 mg/dL (ref 8.7–10.3)
Chloride: 97 mmol/L (ref 96–106)
Creatinine, Ser: 0.79 mg/dL (ref 0.57–1.00)
GFR calc Af Amer: 92 mL/min/{1.73_m2} (ref >59)
GFR calc non Af Amer: 80 mL/min/{1.73_m2} (ref >59)
Globulin, Total: 2.5 g/dL (ref 1.5–4.5)
Glucose: 91 mg/dL (ref 65–99)
Potassium: 4.3 mmol/L (ref 3.5–5.2)
Sodium: 140 mmol/L (ref 134–144)
Total Protein: 7 g/dL (ref 6.0–8.5)

## 2016-12-19 LAB — LIPID PANEL
Chol/HDL Ratio: 3.6 ratio units (ref 0.0–4.4)
Cholesterol, Total: 260 mg/dL — ABNORMAL HIGH (ref 100–199)
HDL: 72 mg/dL (ref >39)
LDL Calculated: 122 mg/dL — ABNORMAL HIGH (ref 0–99)
Triglycerides: 331 mg/dL — ABNORMAL HIGH (ref 0–149)
VLDL Cholesterol Cal: 66 mg/dL — ABNORMAL HIGH (ref 5–40)

## 2016-12-19 LAB — CBC WITH DIFFERENTIAL/PLATELET
Basophils Absolute: 0 10*3/uL (ref 0.0–0.2)
Basos: 0 %
EOS (ABSOLUTE): 0.1 10*3/uL (ref 0.0–0.4)
Eos: 1 %
Hematocrit: 38.6 % (ref 34.0–46.6)
Hemoglobin: 12.5 g/dL (ref 11.1–15.9)
Immature Grans (Abs): 0 10*3/uL (ref 0.0–0.1)
Immature Granulocytes: 0 %
Lymphocytes Absolute: 2.4 10*3/uL (ref 0.7–3.1)
Lymphs: 45 %
MCH: 32.1 pg (ref 26.6–33.0)
MCHC: 32.4 g/dL (ref 31.5–35.7)
MCV: 99 fL — ABNORMAL HIGH (ref 79–97)
Monocytes Absolute: 0.5 10*3/uL (ref 0.1–0.9)
Monocytes: 9 %
Neutrophils Absolute: 2.4 10*3/uL (ref 1.4–7.0)
Neutrophils: 45 %
Platelets: 322 10*3/uL (ref 150–379)
RBC: 3.9 x10E6/uL (ref 3.77–5.28)
RDW: 13.8 % (ref 12.3–15.4)
WBC: 5.4 10*3/uL (ref 3.4–10.8)

## 2016-12-19 LAB — TSH: TSH: 1.16 u[IU]/mL (ref 0.450–4.500)

## 2016-12-20 ENCOUNTER — Telehealth: Payer: Self-pay

## 2016-12-20 LAB — VITAMIN B12: Vitamin B-12: 257 pg/mL (ref 232–1245)

## 2016-12-20 LAB — VITAMIN D 25 HYDROXY (VIT D DEFICIENCY, FRACTURES): Vit D, 25-Hydroxy: 13.7 ng/mL — ABNORMAL LOW (ref 30.0–100.0)

## 2016-12-20 MED ORDER — VITAMIN D (ERGOCALCIFEROL) 1.25 MG (50000 UNIT) PO CAPS: 50000.0000 [IU] | ORAL_CAPSULE | ORAL | 0 refills | Status: DC

## 2016-12-20 NOTE — Telephone Encounter (Signed)
Pt is stating that she has received her lab results but does not see anything related to diabetes testing which is what she was needing to have done   Best number [phone]

## 2016-12-21 ENCOUNTER — Encounter: Payer: Self-pay | Admitting: Physician Assistant

## 2016-12-26 NOTE — Telephone Encounter (Signed)
Spoke with patient informed her that she will not be charged to come in and have her A1c done.   There is a future order in there from 11/27.    Spoke with clerical staff  to confirm.    Patient was upset she needed to come back in.

## 2017-01-15 ENCOUNTER — Ambulatory Visit: Payer: BLUE CROSS/BLUE SHIELD

## 2017-01-16 ENCOUNTER — Ambulatory Visit (INDEPENDENT_AMBULATORY_CARE_PROVIDER_SITE_OTHER): Payer: BLUE CROSS/BLUE SHIELD | Admitting: Family Medicine

## 2017-01-16 ENCOUNTER — Ambulatory Visit (INDEPENDENT_AMBULATORY_CARE_PROVIDER_SITE_OTHER): Payer: BLUE CROSS/BLUE SHIELD

## 2017-01-16 VITALS — BP 122/74 | HR 71 | Temp 97.9°F | Resp 16 | Ht 64.0 in | Wt 132.4 lb

## 2017-01-16 DIAGNOSIS — M25572 Pain in left ankle and joints of left foot: Secondary | ICD-10-CM

## 2017-01-16 DIAGNOSIS — S92502A Displaced unspecified fracture of left lesser toe(s), initial encounter for closed fracture: Secondary | ICD-10-CM

## 2017-01-16 NOTE — Patient Instructions (Addendum)
Wear post op shoe daily. You will be contact by [facility] .  IF you received an x-ray today, you will receive an invoice from [facility]. Please contact [facility] at [phone] with questions or concerns regarding your invoice.   IF you received labwork today, you will receive an invoice from [facility]. Please contact [facility] at [phone] with questions or concerns regarding your invoice.   Our billing staff will not be able to assist you with questions regarding bills from these companies.  You will be contacted with the lab results as soon as they are available. The fastest way to get your results is to activate your My Chart account. Instructions are located on the last page of this paperwork. If you have not heard from us regarding the results in 2 weeks, please contact this office.      Metatarsal Fracture A metatarsal fracture is a broken bone in one of the five bones that connect your toes to the rest of your foot (forefoot fracture). Metatarsals are long bones that can be stressed or cracked easily. A metatarsal fracture can be:  A stress fracture. Stress fractures are cracks in the surface of the metatarsal bone. Athletes often get stress fractures.  A complete fracture. A complete fracture goes all the way through the bone. The bone that connects to the pinky toe (fifth metatarsal) is the most commonly fractured metatarsal. Ballet dancers often fracture this bone. What are the causes? This type of fracture may be caused by:  A sudden twisting of your foot.  A fall onto your foot.  Overuse or repetitive exercise. What increases the risk? This condition is more likely to develop in people who:  Play contact sports.  Have a bone disease.  Have a low calcium level. What are the signs or symptoms? Symptoms of this condition include:  Pain that is worse when walking or standing.  Pain when pressing on the foot or moving the  toes.  Swelling.  Bruising on the top or bottom of the foot.  A foot that appears shorter than the other one. How is this diagnosed? This condition is diagnosed with a physical exam. You may also have imaging tests, such as:  X-rays.  A CT scan.  MRI. How is this treated? Treatment for this condition depends on its severity and whether a bone has moved out of place. Treatment may involve:  Rest.  Wearing foot support such as a cast, splint, or boot for several weeks.  Using crutches.  Surgery to move bones back into the right position. Surgery is usually needed if there are many pieces of broken bone or bones that are very out of place (displaced fracture).  Physical therapy. This may be needed to help you regain full movement and strength in your foot. You will need to return to your health care provider to have X-rays taken until your bones heal. Your health care provider will look at the X-rays to make sure that your foot is healing well. Follow these instructions at home: If you have a cast:  Do not stick anything inside the cast to scratch your skin. Doing that increases your risk of infection.  Check the skin around the cast every day. Report any concerns to your health care provider. You may put lotion on dry skin around the edges of the cast. Do not apply lotion to the skin underneath the cast.  Keep the cast clean and dry. If you have a splint or a supportive  boot:  Wear it as directed by your health care provider. Remove it only as directed by your health care provider.  Loosen it if your toes become numb and tingle, or if they turn cold and blue.  Keep it clean and dry. Bathing  Do not take baths, swim, or use a hot tub until your health care provider approves. Ask your health care provider if you can take showers. You may only be allowed to take sponge baths for bathing.  If your health care provider approves bathing and showering, cover the cast or splint  with a watertight plastic bag to protect it from water. Do not let the cast or splint get wet. Managing pain, stiffness, and swelling  If directed, apply ice to the injured area (if you have a splint, not a cast).  Put ice in a plastic bag.  Place a towel between your skin and the bag.  Leave the ice on for 20 minutes, 2-3 times per day.  Move your toes often to avoid stiffness and to lessen swelling.  Raise (elevate) the injured area above the level of your heart while you are sitting or lying down. Driving  Do not drive or operate heavy machinery while taking pain medicine.  Do not drive while wearing foot support on a foot that you use for driving. Activity  Return to your normal activities as directed by your health care provider. Ask your health care provider what activities are safe for you.  Perform exercises as directed by your health care provider or physical therapist. Safety  Do not use the injured foot to support your body weight until your health care provider says that you can. Use crutches as directed by your health care provider. General instructions  Do not put pressure on any part of the cast or splint until it is fully hardened. This may take several hours.  Do not use any tobacco products, including cigarettes, chewing tobacco, or e-cigarettes. Tobacco can delay bone healing. If you need help quitting, ask your health care provider.  Take medicines only as directed by your health care provider.  Keep all follow-up visits as directed by your health care provider. This is important. Contact a health care provider if:  You have a fever.  Your cast, splint, or boot is too loose or too tight.  Your cast, splint, or boot is damaged.  Your pain medicine is not helping.  You have pain, tingling, or numbness in your foot that is not going away. Get help right away if:  You have severe pain.  You have tingling or numbness in your foot that is getting  worse.  Your foot feels cold or becomes numb.  Your foot changes color. This information is not intended to replace advice given to you by your health care provider. Make sure you discuss any questions you have with your health care provider. Document Released: 09/02/2002 Document Revised: 08/15/2016 Document Reviewed: 10/07/2014 Elsevier Interactive Patient Education  2017 [employer].

## 2017-01-16 NOTE — Progress Notes (Signed)
Patient ID: Carmen Rasmussen, female    DOB: April 12, 1951, 66 y.o.   MRN: [id number]  PCP: Virgilio Belling  Chief Complaint  Patient presents with  . Foot Pain    injured L Fot / pinky toe 1 week ago    Subjective:  HPI 66 year old female presents for evaluation of left toe pain following a toe injury. Last Monday, kicked a table with her left 5th toe deviating to right. Denies bruising. Pain is most distal fifth metarsal joint.  Social History   Social History  . Marital status: Single    Spouse name: N/A  . Number of children: 1  . Years of education: N/A   Occupational History  .      part time   Social History Main Topics  . Smoking status: Never Smoker  . Smokeless tobacco: Never Used  . Alcohol use No     Comment: "now and then"  . Drug use: No  . Sexual activity: Yes    Birth control/ protection: None   Other Topics Concern  . Not on file   Social History Narrative   Live alone   daugther      Seatbelt - 100%   No guns in home    Family History  Problem Relation Age of Onset  . Asthma Mother   . Thyroid disease Mother   . Diabetes Mother   . Stroke Mother   . Diabetes Father   . Colon cancer Sister 25  . Hypertension Brother   . Thyroid disease Sister   . Asthma Sister   . Breast cancer Sister   . Colon cancer Sister   . Colon cancer Maternal Grandmother   . Colon cancer Paternal Aunt     throat  . Colon cancer Paternal Uncle     stomach  . Colon cancer Paternal Aunt     kidney   Review of Systems See HPI Patient Active Problem List   Diagnosis Date Noted  . Spinal stenosis of lumbar region 11/24/2015  . HNP (herniated nucleus pulposus), lumbar 12/23/2014  . DDD (degenerative disc disease), lumbosacral 08/30/2014  . Lumbar radicular pain 08/30/2014  . Sciatica of left side 03/24/2014  . Neck pain 04/07/2012  . Neck pain, chronic 04/07/2012  . Tremor 04/07/2012  . Anxiety 04/07/2012  . Palpitations 06/17/2011  . Carpal tunnel  syndrome   . Idiopathic edema   . History of shingles     Allergies  Allergen Reactions  . Meloxicam Shortness Of Breath and Swelling    Swelling of throat Tolerates ibuprofen, diclofenac    Prior to Admission medications   Medication Sig Start Date End Date Taking? Authorizing Provider  clonazePAM (KLONOPIN) 1 MG tablet Take 1 tablet (1 mg total) by mouth 2 (two) times daily as needed for anxiety. 04/05/16  Yes Tonye Pearson, MD  estradiol (ESTRACE) 1 MG tablet Take 1 tablet (1 mg total) by mouth daily. 12/16/16  Yes Morrell Riddle, PA-C  HYDROcodone-acetaminophen (NORCO) 10-325 MG tablet Take 1 tablet by mouth every 6 (six) hours as needed.   Yes Historical Provider, MD  ibuprofen (ADVIL,MOTRIN) 800 MG tablet Take 1 tablet (800 mg total) by mouth daily. May resume 5 days post op. With food 11/25/15  Yes Dorothy Spark, PA-C  medroxyPROGESTERone (PROVERA) 2.5 MG tablet Take 1 tablet (2.5 mg total) by mouth daily. 12/16/16  Yes Morrell Riddle, PA-C  methocarbamol (ROBAXIN) 500 MG tablet Take 500 mg by mouth 4 (  four) times daily.   Yes Historical Provider, MD  metoprolol tartrate (LOPRESSOR) 25 MG tablet Take 1 tablet (25 mg total) by mouth at bedtime. 12/16/16  Yes Morrell RiddleSarah L Weber, PA-C  ondansetron (ZOFRAN) 8 MG tablet Take 1 tablet (8 mg total) by mouth every 8 (eight) hours as needed for nausea or vomiting. 11/24/15  Yes Jene EveryJeffrey Beane, MD  Vitamin D, Ergocalciferol, (DRISDOL) 50000 units CAPS capsule Take 1 capsule (50,000 Units total) by mouth every 7 (seven) days. 12/20/16  Yes Morrell RiddleSarah L Weber, PA-C    Past Medical, Surgical Family and Social History reviewed and updated.    Objective:   Today's Vitals   01/16/17 1653  BP: 122/74  Pulse: 71  Resp: 16  Temp: 97.9 F (36.6 C)  TempSrc: Oral  SpO2: 98%  Weight: 132 lb 6.4 oz (60.1 kg)  Height: 5\' 4"  (1.626 m)    Wt Readings from Last 3 Encounters:  01/16/17 132 lb 6.4 oz (60.1 kg)  12/16/16 134 lb (60.8 kg)  11/20/16  134 lb (60.8 kg)   Physical Exam  Constitutional: She appears well-developed and well-nourished.  HENT:  Head: Normocephalic and atraumatic.  Eyes: Pupils are equal, round, and reactive to light.  Neck: Normal range of motion. Neck supple.  Cardiovascular: Normal rate, regular rhythm, normal heart sounds and intact distal pulses.   Pulmonary/Chest: Effort normal and breath sounds normal.  Musculoskeletal: She exhibits tenderness.       Left foot: There is tenderness and bony tenderness.  Tenderness at the laterally at 5th metatarsal bone  Neurological: She is alert.  Skin: Skin is warm and dry.  Psychiatric: She has a normal mood and affect. Her behavior is normal. Judgment and thought content normal.     Assessment & Plan:  1. Closed displaced fracture of phalanx of lesser toe of left foot, unspecified phalanx, initial encounter - DG Toe 5th Left - Post-op shoe - AMB referral to orthopedics  Plan: Wear post op shoe daily with 5th toe buddy taped until you follow-up with orthopedics. You will be contacted regarding your appointment with orthopedics.  Godfrey PickKimberly S. Tiburcio PeaHarris, MSN, FNP-C Primary Care at [facility] [phone]

## 2017-01-18 ENCOUNTER — Encounter: Payer: Self-pay | Admitting: Family Medicine

## 2017-01-19 ENCOUNTER — Telehealth: Payer: Self-pay

## 2017-01-19 NOTE — Telephone Encounter (Signed)
Could some research the status of her referral to orthopedics and follow with her to advise of the status.  Thanks,  Godfrey PickKimberly S. Tiburcio PeaHarris, MSN, FNP-C Primary Care at [facility] [phone]

## 2017-01-19 NOTE — Telephone Encounter (Signed)
Pt is needing an xray disc of her foot to take to [facility] on Monday   Best number [phone]  Pt is hoping to pick them up today

## 2017-01-19 NOTE — Telephone Encounter (Signed)
Pt coming in to pick Xray up of her toes she has an appointment at ortho next week

## 2017-02-05 ENCOUNTER — Encounter: Payer: Self-pay | Admitting: Physician Assistant

## 2017-10-19 ENCOUNTER — Ambulatory Visit: Payer: BLUE CROSS/BLUE SHIELD | Admitting: Family Medicine

## 2017-10-23 ENCOUNTER — Ambulatory Visit (INDEPENDENT_AMBULATORY_CARE_PROVIDER_SITE_OTHER): Payer: BLUE CROSS/BLUE SHIELD | Admitting: Physician Assistant

## 2017-10-23 ENCOUNTER — Encounter: Payer: Self-pay | Admitting: Physician Assistant

## 2017-10-23 VITALS — BP 118/62 | HR 112 | Temp 99.6°F | Resp 18 | Ht 64.0 in | Wt 134.6 lb

## 2017-10-23 DIAGNOSIS — B029 Zoster without complications: Secondary | ICD-10-CM | POA: Diagnosis not present

## 2017-10-23 DIAGNOSIS — N951 Menopausal and female climacteric states: Secondary | ICD-10-CM

## 2017-10-23 DIAGNOSIS — Z23 Encounter for immunization: Secondary | ICD-10-CM | POA: Diagnosis not present

## 2017-10-23 MED ORDER — GABAPENTIN 100 MG PO CAPS: 100.0000 mg | ORAL_CAPSULE | Freq: Three times a day (TID) | ORAL | 0 refills | Status: DC

## 2017-10-23 MED ORDER — ZOSTER VAC RECOMB ADJUVANTED 50 MCG/0.5ML IM SUSR: 0.5000 mL | Freq: Once | INTRAMUSCULAR | 0 refills | Status: AC

## 2017-10-23 MED ORDER — ESTRADIOL 1 MG PO TABS: 1.0000 mg | ORAL_TABLET | Freq: Every day | ORAL | 0 refills | Status: DC

## 2017-10-23 MED ORDER — MEDROXYPROGESTERONE ACETATE 2.5 MG PO TABS: 2.5000 mg | ORAL_TABLET | Freq: Every day | ORAL | 0 refills | Status: DC

## 2017-10-23 MED ORDER — VALACYCLOVIR HCL 1 G PO TABS: 1000.0000 mg | ORAL_TABLET | Freq: Three times a day (TID) | ORAL | 0 refills | Status: DC
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
## 2017-10-23 NOTE — Patient Instructions (Addendum)
     IF you received an x-ray today, you will receive an invoice from [facility]. Please contact [facility] at [phone] with questions or concerns regarding your invoice.   IF you received labwork today, you will receive an invoice from [facility]. Please contact [facility] at [phone] with questions or concerns regarding your invoice.   Our billing staff will not be able to assist you with questions regarding bills from these companies.  You will be contacted with the lab results as soon as they are available. The fastest way to get your results is to activate your My Chart account. Instructions are located on the last page of this paperwork. If you have not heard from us regarding the results in 2 weeks, please contact this office.    Gabapentin - Start at 100mg  at night about 2 hours prior to going to bed - if it does not make you sleep you can take this medication 3x/day - if it makes you tired but not groggy in the am you can increase to 300mg  at night -- you can email me though mychart if you have questions - if you are on this for more than 7 days let me know when you are not having pain I will help you wean off the medication

## 2017-10-30 ENCOUNTER — Ambulatory Visit: Payer: Self-pay | Admitting: *Deleted

## 2017-10-30 ENCOUNTER — Telehealth: Payer: Self-pay | Admitting: Physician Assistant

## 2017-10-30 NOTE — Telephone Encounter (Signed)
Left  Message  For  Patient to  Call back

## 2017-10-30 NOTE — Telephone Encounter (Signed)
Copied from CRM [id number]. Topic: Quick Communication - See Telephone Encounter >> Oct 30, 2017 10:45 AM Waymon AmatoBurton, Donna F wrote: CRM for notification. See Telephone encounter for:   10/30/17.pt is needing to have something called in for nausea -she is at day 9 of shingles and has not eaten in 3 days

## 2017-10-30 NOTE — Telephone Encounter (Signed)
Pt   Reports   She    She   Developed   Symptoms   X  9   Days   Recently   And  Was  rx  Anti  Viral    gabentin  She  Reports   Nausea      Has  Not  Eaten  In   3  Days      Pt  Reports  Has  Been   Drink  Coke   Pt reports   Has   Been making  Urine    She  Is  Requesting  An  rx  For  zofran  To  Be  [facility]  [facility]  [facility]  She  Does  Not  Wish  To  Be   Seen for the   Symptoms  She  Reports  She  Feels   weak but   Refuses   An  appointment  Reason for Disposition . Nausea persists > 1 week  Answer Assessment - Initial Assessment Questions 1. NAUSEA SEVERITY: "How bad is the nausea?" (e.g., mild, moderate, severe; dehydration, weight loss)   - MILD: loss of appetite without change in eating habits   - MODERATE: decreased oral intake without significant weight loss, dehydration, or malnutrition   - SEVERE: inadequate caloric or fluid intake, significant weight loss, symptoms of dehydration     Severe 2. ONSET: "When did the nausea begin?"       3  Days    3. VOMITING: "Any vomiting?" If so, ask: "How many times today?"      No   vomiting 4. RECURRENT SYMPTOM: "Have you had nausea before?" If so, ask: "When was the last time?" "What happened that time?"      Not  really 5. CAUSE: "What do you think is causing the nausea?"      Medication  6. PREGNANCY: "Is there any chance you are pregnant?" (e.g., unprotected intercourse, missed birth control pill, broken condom)     none  Protocols used: NAUSEA-A-AH

## 2017-10-30 NOTE — Telephone Encounter (Signed)
Pt was recently seen for shingles.  Now reports nausea.  Does not want to return for OV but would like script for Zofran to be called in.  Please advise.

## 2017-10-31 ENCOUNTER — Telehealth: Payer: Self-pay | Admitting: Physician Assistant

## 2017-11-02 ENCOUNTER — Telehealth: Payer: Self-pay

## 2017-11-02 MED ORDER — ONDANSETRON HCL 8 MG PO TABS: 4.0000 mg | ORAL_TABLET | Freq: Three times a day (TID) | ORAL | 0 refills | Status: DC | PRN

## 2017-11-02 NOTE — Telephone Encounter (Signed)
Call center skyped saying that patient has been waiting on a call since Monday about her shingle issues.     Copied message:  a pt with the MRN: [id number] stated she has been awaiting a response since monday about some assistance for her shingles b/c she is so sick she cant eat, there are some telephone encounters there, whenever someone has a chance to respond let the pt know of what needs to be done.Marland Kitchen..Marland Kitchen

## 2017-11-02 NOTE — Telephone Encounter (Signed)
I sent her in some medication.

## 2017-11-02 NOTE — Telephone Encounter (Signed)
Done

## 2017-11-02 NOTE — Telephone Encounter (Signed)
Copied from CRM [id number]. Topic: Quick Communication - See Telephone Encounter >> Oct 30, 2017 10:45 AM Waymon AmatoBurton, Donna F wrote: CRM for notification. See Telephone encounter for:   10/30/17.pt is needing to have something called in for nausea -she is at day 9 of shingles and has not eaten in 3 days  >> Oct 31, 2017  1:20 PM Cipriano BunkerLambe, Annette S wrote: Pt is calling back about prescription and has not heard anything. She checked with pharmacy and nothing called in.  Please call and leave her a message regarding getting medicine for nausea. >> Nov 02, 2017 11:18 AM Alexander BergeronBarksdale, Harvey B wrote: PT called b/c she is really needing a medicaiton for her nausea, its been five days she has been dealing with this, PT hasnt eaten in 3 days and is struggling, she is wanting at least someone to contact her on what to do or if she can get a Rx

## 2017-11-12 ENCOUNTER — Encounter (HOSPITAL_COMMUNITY): Payer: Self-pay

## 2018-03-25 DIAGNOSIS — M545 Low back pain: Secondary | ICD-10-CM

## 2018-03-25 DIAGNOSIS — G8929 Other chronic pain: Secondary | ICD-10-CM | POA: Insufficient documentation

## 2018-05-17 ENCOUNTER — Other Ambulatory Visit: Payer: Self-pay

## 2018-05-17 ENCOUNTER — Encounter: Payer: Self-pay | Admitting: Physician Assistant

## 2018-05-17 ENCOUNTER — Ambulatory Visit (INDEPENDENT_AMBULATORY_CARE_PROVIDER_SITE_OTHER): Payer: BLUE CROSS/BLUE SHIELD | Admitting: Physician Assistant

## 2018-05-17 VITALS — BP 112/60 | HR 76 | Temp 99.2°F | Resp 18 | Ht 64.57 in | Wt 133.6 lb

## 2018-05-17 DIAGNOSIS — E559 Vitamin D deficiency, unspecified: Secondary | ICD-10-CM | POA: Diagnosis not present

## 2018-05-17 DIAGNOSIS — Z13228 Encounter for screening for other metabolic disorders: Secondary | ICD-10-CM | POA: Diagnosis not present

## 2018-05-17 DIAGNOSIS — Z0000 Encounter for general adult medical examination without abnormal findings: Secondary | ICD-10-CM | POA: Diagnosis not present

## 2018-05-17 DIAGNOSIS — N951 Menopausal and female climacteric states: Secondary | ICD-10-CM | POA: Diagnosis not present

## 2018-05-17 DIAGNOSIS — R251 Tremor, unspecified: Secondary | ICD-10-CM | POA: Diagnosis not present

## 2018-05-17 DIAGNOSIS — Z1389 Encounter for screening for other disorder: Secondary | ICD-10-CM

## 2018-05-17 DIAGNOSIS — Z130 Encounter for screening for diseases of the blood and blood-forming organs and certain disorders involving the immune mechanism: Secondary | ICD-10-CM

## 2018-05-17 DIAGNOSIS — Z8349 Family history of other endocrine, nutritional and metabolic diseases: Secondary | ICD-10-CM

## 2018-05-17 DIAGNOSIS — E782 Mixed hyperlipidemia: Secondary | ICD-10-CM | POA: Diagnosis not present

## 2018-05-17 DIAGNOSIS — E538 Deficiency of other specified B group vitamins: Secondary | ICD-10-CM | POA: Diagnosis not present

## 2018-05-17 DIAGNOSIS — Z131 Encounter for screening for diabetes mellitus: Secondary | ICD-10-CM | POA: Diagnosis not present

## 2018-05-17 MED ORDER — MEDROXYPROGESTERONE ACETATE 2.5 MG PO TABS: 2.5000 mg | ORAL_TABLET | Freq: Every day | ORAL | 4 refills | Status: DC

## 2018-05-17 MED ORDER — METOPROLOL TARTRATE 25 MG PO TABS
25.0000 mg | ORAL_TABLET | ORAL | 4 refills | Status: DC
Start: 2018-05-17 — End: 2020-01-14

## 2018-05-17 MED ORDER — ESTRADIOL 0.5 MG PO TABS: 0.5000 mg | ORAL_TABLET | Freq: Every day | ORAL | 4 refills | Status: DC

## 2018-05-17 NOTE — Progress Notes (Signed)
Carmen Rasmussen  MRN: [id number] DOB: Mar 14, 1951  PCP: Mancel Bale, PA-C   Chief Complaint  Patient presents with  . Annual Exam    no pap   . Medication Refill    estrace and provera and lopressor     Subjective:  Pt presents to clinic for a CPE.  Last dental exam: plans to schedule - she has 2 cavities - she has not been in 3 years Last vision exam: wears glasses - was last year - just got new glasses Last pap: 3ish years ago - she used to see Dr Harrington Challenger - she does not want one today and next month she will be 67 y/o  Last mammo: 5ish years ago - dense breast does not want to go yearly Last colonoscopy: she has had 3 - Dr Collene Mares - 2010-2015 no polyps removed - sister was mid 57s diagnosed with colon cancer - she does not know when she needs another one (I reviewed chart and did not find a copy of her last colonoscopy)  Vaccinations      Tdap - UTD      Shingrix - on a list to get - she has had shingles more than once  Hands shaking - people are asking if she drank to much coffee because her hands are shaking. - metoprolol has helped her in the past with that - she takes in the am - her shaking is worse in the am - she is not sure if it is worse at rest with with acitivity - she knows she does not have it all the time  She is schedule for an MRI next week and she is planning on another lumbar surgery.  On estrogen therapy for 10 years - she has been off a couple of months - unbearable hot flashes at night - she feels like she has started to grow upper lip thick black hair (grnadadughter has commented on it) -- She has been on neurontin for her back and she is not going to take on it.   Typical meals for patient: not interested in fixing food, small breakfast and small lunch and rare dinner Typical beverage choices: 3 cups coffee with cream, 1 coke and 1 ice tea a day Exercises: walks dog daily 3x/day Sleeps: hot flashes have returned since stopping her estrogen, sleeping about 9  hours - when she is woken up she has trouble falling asleep again (happens about once a night) wakes up by alarm and feels rested  Patient Active Problem List   Diagnosis Date Noted  . Menopausal syndrome 05/17/2018  . Chronic low back pain 03/25/2018  . Spinal stenosis of lumbar region 11/24/2015  . HNP (herniated nucleus pulposus), lumbar 12/23/2014  . DDD (degenerative disc disease), lumbosacral 08/30/2014  . Lumbar radicular pain 08/30/2014  . Sciatica of left side 03/24/2014  . Neck pain 04/07/2012  . Neck pain, chronic 04/07/2012  . Tremor 04/07/2012  . Anxiety 04/07/2012  . Palpitations 06/17/2011  . Carpal tunnel syndrome   . Idiopathic edema   . History of shingles     Patient Care Team: Mittie Bodo as PCP - General (Physician Assistant) Suella Broad, MD as Consulting Physician (Physical Medicine and Rehabilitation) Susa Day, MD as Consulting Physician (Orthopedic Surgery)  Review of Systems  Constitutional: Negative.   HENT: Negative.   Eyes: Negative.   Respiratory: Negative.   Cardiovascular: Negative.   Gastrointestinal: Negative.   Endocrine: Negative.   Genitourinary: Negative.  Menopausal hot flashes  Musculoskeletal: Negative.   Skin: Negative.   Allergic/Immunologic: Negative.   Neurological: Negative.   Hematological: Negative.   Psychiatric/Behavioral: Negative.      Current Outpatient Medications on File Prior to Visit  Medication Sig Dispense Refill  . HYDROcodone-acetaminophen (NORCO) 10-325 MG tablet Take 1 tablet by mouth every 6 (six) hours as needed.    . Vitamin D, Ergocalciferol, (DRISDOL) 50000 units CAPS capsule Take 1 capsule (50,000 Units total) by mouth every 7 (seven) days. 12 capsule 0   No current facility-administered medications on file prior to visit.     Allergies  Allergen Reactions  . Meloxicam Shortness Of Breath and Swelling    Swelling of throat Tolerates ibuprofen, diclofenac    Social  History   Socioeconomic History  . Marital status: Single    Spouse name: Not on file  . Number of children: 1  . Years of education: Not on file  . Highest education level: Not on file  Occupational History    Comment: part time  Social Needs  . Financial resource strain: Not on file  . Food insecurity:    Worry: Not on file    Inability: Not on file  . Transportation needs:    Medical: Not on file    Non-medical: Not on file  Tobacco Use  . Smoking status: Never Smoker  . Smokeless tobacco: Never Used  Substance and Sexual Activity  . Alcohol use: No    Alcohol/week: 0.0 oz    Comment: "now and then"  . Drug use: No  . Sexual activity: Not Currently    Birth control/protection: None  Lifestyle  . Physical activity:    Days per week: Not on file    Minutes per session: Not on file  . Stress: Not on file  Relationships  . Social connections:    Talks on phone: Not on file    Gets together: Not on file    Attends religious service: Not on file    Active member of club or organization: Not on file    Attends meetings of clubs or organizations: Not on file    Relationship status: Not on file  Other Topics Concern  . Not on file  Social History Narrative   Live alone   daugther      Seatbelt - 100%   No guns in home    Past Surgical History:  Procedure Laterality Date  . CESAREAN SECTION    . colonscopy      x 3  . LUMBAR LAMINECTOMY/DECOMPRESSION MICRODISCECTOMY Right 12/23/2014   Procedure: MICRO LUMBAR DECOMPRESSION L5 - S1 ON THE RIGHT    (LEVEL1)    ;  Surgeon: Johnn Hai, MD;  Location: [facility];  Service: Orthopedics;  Laterality: Right;  . LUMBAR LAMINECTOMY/DECOMPRESSION MICRODISCECTOMY Right 11/24/2015   Procedure: MICRO LUMBER DECOMPRESSION L5-S1 ON RIGHT REMOVAL OF SYNOVIAL CYST;  Surgeon: Susa Day, MD;  Location: [facility];  Service: Orthopedics;  Laterality: Right;  . UPPER GI ENDOSCOPY     x 2    Family History  Problem Relation Age of  Onset  . Asthma Mother   . Thyroid disease Mother   . Diabetes Mother   . Stroke Mother   . Diabetes Father   . Thyroid disease Sister   . Asthma Sister   . Hypertension Brother   . Colon cancer Sister 40  . Colon cancer Maternal Grandmother   . Colon cancer Paternal Aunt  throat  . Colon cancer Paternal Uncle        stomach  . Colon cancer Paternal Aunt        kidney     Objective:  BP 112/60   Pulse 76   Temp 99.2 F (37.3 C) (Oral)   Resp 18   Ht 5' 4.57" (1.64 m)   Wt 133 lb 9.6 oz (60.6 kg)   SpO2 97%   BMI 22.53 kg/m   Physical Exam  Constitutional: She is oriented to person, place, and time. She appears well-developed and well-nourished.  HENT:  Head: Normocephalic and atraumatic.  Right Ear: Hearing, tympanic membrane, external ear and ear canal normal.  Left Ear: Hearing, tympanic membrane, external ear and ear canal normal.  Nose: Nose normal.  Mouth/Throat: Uvula is midline, oropharynx is clear and moist and mucous membranes are normal.  Eyes: Pupils are equal, round, and reactive to light. Conjunctivae, EOM and lids are normal. Right eye exhibits no discharge. Left eye exhibits no discharge.  Neck: Trachea normal and normal range of motion. Neck supple. No thyroid mass and no thyromegaly present.  Cardiovascular: Normal rate, regular rhythm and normal heart sounds.  No murmur heard. Pulmonary/Chest: Effort normal and breath sounds normal. She has no wheezes.  Abdominal: Soft. Normal appearance and bowel sounds are normal. There is no tenderness.  Musculoskeletal: Normal range of motion.  Lymphadenopathy:       Head (right side): No tonsillar, no preauricular, no posterior auricular and no occipital adenopathy present.       Head (left side): No tonsillar, no preauricular, no posterior auricular and no occipital adenopathy present.    She has no cervical adenopathy.       Right: No supraclavicular adenopathy present.       Left: No supraclavicular  adenopathy present.  Neurological: She is alert and oriented to person, place, and time. She has normal strength and normal reflexes.  Skin: Skin is warm, dry and intact.  Psychiatric: She has a normal mood and affect. Her speech is normal and behavior is normal. Judgment and thought content normal.  Vitals reviewed.   Wt Readings from Last 3 Encounters:  05/17/18 133 lb 9.6 oz (60.6 kg)  10/23/17 134 lb 9.6 oz (61.1 kg)  01/16/17 132 lb 6.4 oz (60.1 kg)     Visual Acuity Screening   Right eye Left eye Both eyes  Without correction:     With correction: '20/25 20/30 20/20 '$    Assessment and Plan :  Annual physical exam - anticipatory guidance -screening labs checked identify problems and treat after their return  Screening for deficiency anemia - Plan: CBC with Differential/Platelet  Screening for metabolic disorder - Plan: CMP14+EGFR  Elevated cholesterol with elevated triglycerides - Plan: Lipid panel  Vitamin B 12 deficiency - Plan: Vitamin B12  Vitamin D deficiency - Plan: VITAMIN D 25 Hydroxy (Vit-D Deficiency, Fractures)  Screening for blood or protein in urine - Plan: Urinalysis, dipstick only  Family history of thyroid disease - Plan: TSH  Screening for diabetes mellitus (DM) - Plan: Hemoglobin A1c  Post menopausal syndrome - Plan: estradiol (ESTRACE) 0.5 MG tablet, medroxyPROGESTERone (PROVERA) 2.5 MG tablet -had long discussion with patient that she has been on hormones for a very long time.  Since stopping them she has had recurrent symptoms which she describes as very bad though from her sleep patterns might not be affecting her sleep as much as she feels.  Discussed with her estrogens risk of increased risk  of breast cancer, increased risk of heart attack and stroke.  She believes her life is better with hormones and would rather take hormones and have possibility of the above things happen.  She is willing to accept the risks.  Gave her option of just progesterone  only for menopausal hot flashes she did not agree to that plan.  I gave her a year supply and told her we would discuss this again next year.  She was agreeable to decreasing her estrogen dose.  Tremor - Plan: metoprolol tartrate (LOPRESSOR) 25 MG tablet -restart this medication suggested patient take in the morning as that is when her symptoms are the worst  She will continue her care for her back with Dr. Nelva Bush and Dr. Tonita Cong.   discussed hormone therapy and other options with Dr. Sloan Leiter.  Windell Hummingbird PA-C  [facility] 05/17/2018 6:48 PM

## 2018-05-17 NOTE — Patient Instructions (Addendum)
[email or web address] - username for mychart  I will contact you with your lab results as soon as they are available.   If you have not heard from me in 2 weeks, please contact me.  The fastest way to get your results is to register for My Chart (see the instructions on this printout).  We will restart estrogen and progesterone at half the dose to control your symptoms but put you at less risk of heart disease, stroke and breast cancer than at the higher dose.  Schedule your mammogram.    IF you received an x-ray today, you will receive an invoice from [facility]. Please contact [facility] at [phone] with questions or concerns regarding your invoice.   IF you received labwork today, you will receive an invoice from [facility]. Please contact [facility] at [phone] with questions or concerns regarding your invoice.   Our billing staff will not be able to assist you with questions regarding bills from these companies.  You will be contacted with the lab results as soon as they are available. The fastest way to get your results is to activate your My Chart account. Instructions are located on the last page of this paperwork. If you have not heard from [place] regarding the results in 2 weeks, please contact this office.

## 2018-05-18 LAB — URINALYSIS, DIPSTICK ONLY
Bilirubin, UA: NEGATIVE
Glucose, UA: NEGATIVE
Ketones, UA: NEGATIVE
Leukocytes, UA: NEGATIVE
Nitrite, UA: NEGATIVE
Protein, UA: NEGATIVE
RBC, UA: NEGATIVE
Specific Gravity, UA: 1.012 (ref 1.005–1.030)
Urobilinogen, Ur: 0.2 mg/dL (ref 0.2–1.0)
pH, UA: 6 (ref 5.0–7.5)

## 2018-05-18 LAB — LIPID PANEL
Chol/HDL Ratio: 3.2 ratio (ref 0.0–4.4)
Cholesterol, Total: 300 mg/dL — ABNORMAL HIGH (ref 100–199)
HDL: 94 mg/dL (ref >39)
LDL Calculated: 170 mg/dL — ABNORMAL HIGH (ref 0–99)
Triglycerides: 179 mg/dL — ABNORMAL HIGH (ref 0–149)
VLDL Cholesterol Cal: 36 mg/dL (ref 5–40)

## 2018-05-18 LAB — CBC WITH DIFFERENTIAL/PLATELET
Basophils Absolute: 0 10*3/uL (ref 0.0–0.2)
Basos: 0 %
EOS (ABSOLUTE): 0.1 10*3/uL (ref 0.0–0.4)
Eos: 2 %
Hematocrit: 39 % (ref 34.0–46.6)
Hemoglobin: 12.7 g/dL (ref 11.1–15.9)
Immature Grans (Abs): 0 10*3/uL (ref 0.0–0.1)
Immature Granulocytes: 0 %
Lymphocytes Absolute: 2.2 10*3/uL (ref 0.7–3.1)
Lymphs: 44 %
MCH: 31.6 pg (ref 26.6–33.0)
MCHC: 32.6 g/dL (ref 31.5–35.7)
MCV: 97 fL (ref 79–97)
Monocytes Absolute: 0.4 10*3/uL (ref 0.1–0.9)
Monocytes: 8 %
Neutrophils Absolute: 2.3 10*3/uL (ref 1.4–7.0)
Neutrophils: 46 %
Platelets: 267 10*3/uL (ref 150–450)
RBC: 4.02 x10E6/uL (ref 3.77–5.28)
RDW: 13.7 % (ref 12.3–15.4)
WBC: 5 10*3/uL (ref 3.4–10.8)

## 2018-05-18 LAB — CMP14+EGFR
ALT: 20 IU/L (ref 0–32)
AST: 25 IU/L (ref 0–40)
Albumin/Globulin Ratio: 1.8 (ref 1.2–2.2)
Albumin: 4.4 g/dL (ref 3.6–4.8)
Alkaline Phosphatase: 74 IU/L (ref 39–117)
BUN/Creatinine Ratio: 13 (ref 12–28)
BUN: 12 mg/dL (ref 8–27)
Bilirubin Total: 0.3 mg/dL (ref 0.0–1.2)
CO2: 25 mmol/L (ref 20–29)
Calcium: 9.4 mg/dL (ref 8.7–10.3)
Chloride: 98 mmol/L (ref 96–106)
Creatinine, Ser: 0.9 mg/dL (ref 0.57–1.00)
GFR calc Af Amer: 78 mL/min/{1.73_m2} (ref >59)
GFR calc non Af Amer: 68 mL/min/{1.73_m2} (ref >59)
Globulin, Total: 2.4 g/dL (ref 1.5–4.5)
Glucose: 88 mg/dL (ref 65–99)
Potassium: 4 mmol/L (ref 3.5–5.2)
Sodium: 139 mmol/L (ref 134–144)
Total Protein: 6.8 g/dL (ref 6.0–8.5)

## 2018-05-18 LAB — VITAMIN D 25 HYDROXY (VIT D DEFICIENCY, FRACTURES): Vit D, 25-Hydroxy: 35.8 ng/mL (ref 30.0–100.0)

## 2018-05-18 LAB — TSH: TSH: 0.786 u[IU]/mL (ref 0.450–4.500)

## 2018-05-18 LAB — HEMOGLOBIN A1C
Est. average glucose Bld gHb Est-mCnc: 105 mg/dL
Hgb A1c MFr Bld: 5.3 % (ref 4.8–5.6)

## 2018-05-18 LAB — VITAMIN B12: Vitamin B-12: 287 pg/mL (ref 232–1245)

## 2018-05-30 DIAGNOSIS — M545 Low back pain: Secondary | ICD-10-CM | POA: Diagnosis not present

## 2018-06-05 DIAGNOSIS — M961 Postlaminectomy syndrome, not elsewhere classified: Secondary | ICD-10-CM | POA: Diagnosis not present

## 2018-06-05 DIAGNOSIS — M48061 Spinal stenosis, lumbar region without neurogenic claudication: Secondary | ICD-10-CM | POA: Diagnosis not present

## 2018-06-05 DIAGNOSIS — M5136 Other intervertebral disc degeneration, lumbar region: Secondary | ICD-10-CM | POA: Diagnosis not present

## 2018-06-05 DIAGNOSIS — M419 Scoliosis, unspecified: Secondary | ICD-10-CM | POA: Diagnosis not present

## 2018-06-05 DIAGNOSIS — M545 Low back pain: Secondary | ICD-10-CM | POA: Diagnosis not present

## 2018-06-20 DIAGNOSIS — F4323 Adjustment disorder with mixed anxiety and depressed mood: Secondary | ICD-10-CM | POA: Diagnosis not present

## 2018-06-28 ENCOUNTER — Ambulatory Visit: Payer: Self-pay | Admitting: *Deleted

## 2018-06-28 ENCOUNTER — Ambulatory Visit: Payer: Self-pay | Admitting: Urgent Care

## 2018-06-28 NOTE — Telephone Encounter (Signed)
Pt reports facial swelling, mild, left jaw area. "May be bad tooth, I don't have a dentist yet." Onset of swelling this AM, onset of pain last night. States 7/10 pain, afebrile. Same day appt made with M. Urban GibsonMani. Care advise given per protocol.  Reason for Disposition . Swelling is painful to touch  Answer Assessment - Initial Assessment Questions 1. ONSET: "When did the swelling start?" (e.g., minutes, hours, days)     This am 2. LOCATION: "What part of the face is swollen?"     Left side at jaw 3. SEVERITY: "How swollen is it?"     Slightly 4. ITCHING: "Is there any itching?" If so, ask: "How much?"   (Scale 1-10; mild, moderate or severe)     no 5. PAIN: "Is the swelling painful to touch?" If so, ask: "How painful is it?"   (Scale 1-10; mild, moderate or severe)     7/10 6. FEVER: "Do you have a fever?" If so, ask: "What is it, how was it measured, and when did it start?"      no 7. CAUSE: "What do you think is causing the face swelling?"     "bad tooth" 8. RECURRENT SYMPTOM: "Have you had face swelling before?" If so, ask: "When was the last time?" "What happened that time?"     no 9. OTHER SYMPTOMS: "Do you have any other symptoms?" (e.g., toothache, leg swelling)     toothache  Protocols used: [email or web address]

## 2018-07-03 DIAGNOSIS — M419 Scoliosis, unspecified: Secondary | ICD-10-CM | POA: Diagnosis not present

## 2018-07-03 DIAGNOSIS — M79672 Pain in left foot: Secondary | ICD-10-CM | POA: Diagnosis not present

## 2018-07-03 DIAGNOSIS — M961 Postlaminectomy syndrome, not elsewhere classified: Secondary | ICD-10-CM | POA: Diagnosis not present

## 2018-07-03 DIAGNOSIS — M545 Low back pain: Secondary | ICD-10-CM | POA: Diagnosis not present

## 2018-07-03 DIAGNOSIS — M48061 Spinal stenosis, lumbar region without neurogenic claudication: Secondary | ICD-10-CM | POA: Diagnosis not present

## 2018-07-03 DIAGNOSIS — M542 Cervicalgia: Secondary | ICD-10-CM | POA: Diagnosis not present

## 2018-07-03 DIAGNOSIS — M5136 Other intervertebral disc degeneration, lumbar region: Secondary | ICD-10-CM | POA: Diagnosis not present

## 2018-09-18 DIAGNOSIS — M48061 Spinal stenosis, lumbar region without neurogenic claudication: Secondary | ICD-10-CM | POA: Diagnosis not present

## 2018-12-30 ENCOUNTER — Ambulatory Visit: Payer: Self-pay | Admitting: *Deleted

## 2018-12-30 NOTE — Telephone Encounter (Signed)
Summary: painful sore on back    Patient is requesting a call back from nurse to discuss painful spot on her back. Patient states that she has had shingles twice and is worried. Patient states that she has previously been prescribed a "shingles shot" but never picked it up from pharmacy. Patient refused appointment.

## 2018-12-30 NOTE — Telephone Encounter (Signed)
Attempted to contact pt; left message on voicemail [phone]; will route to office for final disposition.

## 2019-01-01 NOTE — Telephone Encounter (Signed)
Patient needs an appointment

## 2019-04-29 DIAGNOSIS — M62830 Muscle spasm of back: Secondary | ICD-10-CM | POA: Diagnosis not present

## 2019-05-08 ENCOUNTER — Encounter: Payer: Self-pay | Admitting: Family Medicine

## 2019-05-09 ENCOUNTER — Other Ambulatory Visit: Payer: Self-pay

## 2019-05-09 ENCOUNTER — Telehealth: Payer: Self-pay | Admitting: Family Medicine

## 2019-05-09 DIAGNOSIS — H9203 Otalgia, bilateral: Secondary | ICD-10-CM | POA: Diagnosis not present

## 2019-05-09 DIAGNOSIS — J029 Acute pharyngitis, unspecified: Secondary | ICD-10-CM | POA: Diagnosis not present

## 2019-05-09 NOTE — Telephone Encounter (Signed)
Called pt  To have her call before her appt today with updated insurance information FR

## 2019-09-02 DIAGNOSIS — M5416 Radiculopathy, lumbar region: Secondary | ICD-10-CM | POA: Diagnosis not present

## 2019-09-02 DIAGNOSIS — Z79891 Long term (current) use of opiate analgesic: Secondary | ICD-10-CM | POA: Diagnosis not present

## 2019-09-02 DIAGNOSIS — M5136 Other intervertebral disc degeneration, lumbar region: Secondary | ICD-10-CM | POA: Diagnosis not present

## 2019-09-02 DIAGNOSIS — Z5181 Encounter for therapeutic drug level monitoring: Secondary | ICD-10-CM | POA: Diagnosis not present

## 2019-09-02 DIAGNOSIS — Z79899 Other long term (current) drug therapy: Secondary | ICD-10-CM | POA: Diagnosis not present

## 2019-09-02 DIAGNOSIS — M961 Postlaminectomy syndrome, not elsewhere classified: Secondary | ICD-10-CM | POA: Diagnosis not present

## 2019-09-04 DIAGNOSIS — B852 Pediculosis, unspecified: Secondary | ICD-10-CM | POA: Diagnosis not present

## 2019-09-04 DIAGNOSIS — R002 Palpitations: Secondary | ICD-10-CM | POA: Diagnosis not present

## 2019-09-05 DIAGNOSIS — M48061 Spinal stenosis, lumbar region without neurogenic claudication: Secondary | ICD-10-CM | POA: Diagnosis not present

## 2019-09-05 DIAGNOSIS — M961 Postlaminectomy syndrome, not elsewhere classified: Secondary | ICD-10-CM | POA: Diagnosis not present

## 2019-09-05 DIAGNOSIS — M4317 Spondylolisthesis, lumbosacral region: Secondary | ICD-10-CM | POA: Diagnosis not present

## 2019-09-05 DIAGNOSIS — M5136 Other intervertebral disc degeneration, lumbar region: Secondary | ICD-10-CM | POA: Diagnosis not present

## 2019-09-05 DIAGNOSIS — Z79891 Long term (current) use of opiate analgesic: Secondary | ICD-10-CM | POA: Diagnosis not present

## 2019-09-05 DIAGNOSIS — M419 Scoliosis, unspecified: Secondary | ICD-10-CM | POA: Diagnosis not present

## 2019-09-05 DIAGNOSIS — M545 Low back pain: Secondary | ICD-10-CM | POA: Diagnosis not present

## 2019-09-25 DIAGNOSIS — Z13220 Encounter for screening for lipoid disorders: Secondary | ICD-10-CM | POA: Diagnosis not present

## 2019-09-25 DIAGNOSIS — Z0000 Encounter for general adult medical examination without abnormal findings: Secondary | ICD-10-CM | POA: Diagnosis not present

## 2019-09-25 DIAGNOSIS — F419 Anxiety disorder, unspecified: Secondary | ICD-10-CM | POA: Diagnosis not present

## 2019-09-25 DIAGNOSIS — Z23 Encounter for immunization: Secondary | ICD-10-CM | POA: Diagnosis not present

## 2019-09-25 DIAGNOSIS — G250 Essential tremor: Secondary | ICD-10-CM | POA: Diagnosis not present

## 2019-09-25 DIAGNOSIS — R252 Cramp and spasm: Secondary | ICD-10-CM | POA: Diagnosis not present

## 2019-10-23 DIAGNOSIS — Z23 Encounter for immunization: Secondary | ICD-10-CM | POA: Diagnosis not present

## 2019-11-29 DIAGNOSIS — J029 Acute pharyngitis, unspecified: Secondary | ICD-10-CM | POA: Diagnosis not present

## 2019-11-29 DIAGNOSIS — H9202 Otalgia, left ear: Secondary | ICD-10-CM | POA: Diagnosis not present

## 2019-11-29 DIAGNOSIS — H6690 Otitis media, unspecified, unspecified ear: Secondary | ICD-10-CM | POA: Diagnosis not present

## 2019-11-30 DIAGNOSIS — J029 Acute pharyngitis, unspecified: Secondary | ICD-10-CM | POA: Diagnosis not present

## 2019-12-02 DIAGNOSIS — H9202 Otalgia, left ear: Secondary | ICD-10-CM | POA: Diagnosis not present

## 2019-12-02 DIAGNOSIS — J029 Acute pharyngitis, unspecified: Secondary | ICD-10-CM | POA: Diagnosis not present

## 2019-12-30 DIAGNOSIS — M545 Low back pain: Secondary | ICD-10-CM | POA: Diagnosis not present

## 2019-12-30 DIAGNOSIS — M419 Scoliosis, unspecified: Secondary | ICD-10-CM | POA: Diagnosis not present

## 2019-12-30 DIAGNOSIS — Z79891 Long term (current) use of opiate analgesic: Secondary | ICD-10-CM | POA: Diagnosis not present

## 2019-12-30 DIAGNOSIS — M48061 Spinal stenosis, lumbar region without neurogenic claudication: Secondary | ICD-10-CM | POA: Diagnosis not present

## 2019-12-30 DIAGNOSIS — M961 Postlaminectomy syndrome, not elsewhere classified: Secondary | ICD-10-CM | POA: Diagnosis not present

## 2019-12-30 DIAGNOSIS — M5136 Other intervertebral disc degeneration, lumbar region: Secondary | ICD-10-CM | POA: Diagnosis not present

## 2019-12-30 DIAGNOSIS — M4317 Spondylolisthesis, lumbosacral region: Secondary | ICD-10-CM | POA: Diagnosis not present

## 2020-01-09 DIAGNOSIS — M47816 Spondylosis without myelopathy or radiculopathy, lumbar region: Secondary | ICD-10-CM | POA: Diagnosis not present

## 2020-01-12 DIAGNOSIS — M549 Dorsalgia, unspecified: Secondary | ICD-10-CM | POA: Diagnosis not present

## 2020-01-12 DIAGNOSIS — R252 Cramp and spasm: Secondary | ICD-10-CM | POA: Diagnosis not present

## 2020-01-16 ENCOUNTER — Ambulatory Visit: Payer: PPO | Admitting: Neurology

## 2020-01-16 ENCOUNTER — Encounter: Payer: Self-pay | Admitting: Neurology

## 2020-01-16 ENCOUNTER — Telehealth: Payer: Self-pay | Admitting: Neurology

## 2020-01-16 ENCOUNTER — Other Ambulatory Visit: Payer: Self-pay

## 2020-01-16 VITALS — BP 127/79 | HR 82 | Temp 97.1°F | Ht 64.0 in | Wt 141.8 lb

## 2020-01-16 DIAGNOSIS — G4762 Sleep related leg cramps: Secondary | ICD-10-CM | POA: Diagnosis not present

## 2020-01-16 DIAGNOSIS — M545 Low back pain: Secondary | ICD-10-CM | POA: Diagnosis not present

## 2020-01-16 DIAGNOSIS — G8929 Other chronic pain: Secondary | ICD-10-CM | POA: Diagnosis not present

## 2020-01-16 HISTORY — DX: Sleep related leg cramps: G47.62
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
## 2020-01-16 NOTE — Patient Instructions (Signed)
Leg Cramps Leg cramps occur when one or more muscles tighten and you have no control over this tightening (involuntary muscle contraction). Muscle cramps can develop in any muscle, but the most common place is in the calf muscles of the leg. Those cramps can occur during exercise or when you are at rest. Leg cramps are painful, and they may last for a few seconds to a few minutes. Cramps may return several times before they finally stop. Usually, leg cramps are not caused by a serious medical problem. In many cases, the cause is not known. Some common causes include:  Excessive physical effort (overexertion), such as during intense exercise.  Overuse from repetitive motions, or doing the same thing over and over.  Staying in a certain position for a long period of time.  Improper preparation, form, or technique while performing a sport or an activity.  Dehydration.  Injury.  Side effects of certain medicines.  Abnormally low levels of minerals in your blood (electrolytes), especially potassium and calcium. This could result from: ? Pregnancy. ? Taking diuretic medicines. Follow these instructions at home: Eating and drinking  Drink enough fluid to keep your urine pale yellow. Staying hydrated may help prevent cramps.  Eat a healthy diet that includes plenty of nutrients to help your muscles function. A healthy diet includes fruits and vegetables, lean protein, whole grains, and low-fat or nonfat dairy products. Managing pain, stiffness, and swelling      Try massaging, stretching, and relaxing the affected muscle. Do this for several minutes at a time.  If directed, put ice on areas that are sore or painful after a cramp: ? Put ice in a plastic bag. ? Place a towel between your skin and the bag. ? Leave the ice on for 20 minutes, 2-3 times a day.  If directed, apply heat to muscles that are tense or tight. Do this before you exercise, or as often as told by your health care  provider. Use the heat source that your health care provider recommends, such as a moist heat pack or a heating pad. ? Place a towel between your skin and the heat source. ? Leave the heat on for 20-30 minutes. ? Remove the heat if your skin turns bright red. This is especially important if you are unable to feel pain, heat, or cold. You may have a greater risk of getting burned.  Try taking hot showers or baths to help relax tight muscles. General instructions  If you are having frequent leg cramps, avoid intense exercise for several days.  Take over-the-counter and prescription medicines only as told by your health care provider.  Keep all follow-up visits as told by your health care provider. This is important. Contact a health care provider if:  Your leg cramps get more severe or more frequent, or they do not improve over time.  Your foot becomes cold, numb, or blue. Summary  Muscle cramps can develop in any muscle, but the most common place is in the calf muscles of the leg.  Leg cramps are painful, and they may last for a few seconds to a few minutes.  Usually, leg cramps are not caused by a serious medical problem. Often, the cause is not known.  Stay hydrated and take over-the-counter and prescription medicines only as told by your health care provider. This information is not intended to replace advice given to you by your health care provider. Make sure you discuss any questions you have with your health care   provider. Document Revised: 11/23/2017 Document Reviewed: 09/20/2017 Elsevier Patient Education  2020 Elsevier Inc.  

## 2020-01-16 NOTE — Telephone Encounter (Signed)
Patient was seen today and states that she does not wish to make the recommended 4 month follow-up at this time. (Return in about 4 months (around 05/15/2020) for May see NP).

## 2020-01-26 ENCOUNTER — Ambulatory Visit: Payer: Self-pay

## 2020-02-01 ENCOUNTER — Ambulatory Visit: Payer: PPO

## 2020-02-02 DIAGNOSIS — M542 Cervicalgia: Secondary | ICD-10-CM | POA: Diagnosis not present

## 2020-02-02 DIAGNOSIS — Z79891 Long term (current) use of opiate analgesic: Secondary | ICD-10-CM | POA: Diagnosis not present

## 2020-02-02 DIAGNOSIS — M5136 Other intervertebral disc degeneration, lumbar region: Secondary | ICD-10-CM | POA: Diagnosis not present

## 2020-02-02 DIAGNOSIS — M961 Postlaminectomy syndrome, not elsewhere classified: Secondary | ICD-10-CM | POA: Diagnosis not present

## 2020-02-02 DIAGNOSIS — M545 Low back pain: Secondary | ICD-10-CM | POA: Diagnosis not present

## 2020-02-03 DIAGNOSIS — L659 Nonscarring hair loss, unspecified: Secondary | ICD-10-CM | POA: Diagnosis not present

## 2020-02-10 DIAGNOSIS — L659 Nonscarring hair loss, unspecified: Secondary | ICD-10-CM | POA: Diagnosis not present

## 2020-03-05 DIAGNOSIS — M47816 Spondylosis without myelopathy or radiculopathy, lumbar region: Secondary | ICD-10-CM | POA: Diagnosis not present

## 2020-03-18 DIAGNOSIS — M961 Postlaminectomy syndrome, not elsewhere classified: Secondary | ICD-10-CM | POA: Diagnosis not present

## 2020-03-18 DIAGNOSIS — M5136 Other intervertebral disc degeneration, lumbar region: Secondary | ICD-10-CM | POA: Diagnosis not present

## 2020-03-18 DIAGNOSIS — Z79891 Long term (current) use of opiate analgesic: Secondary | ICD-10-CM | POA: Diagnosis not present

## 2020-03-18 DIAGNOSIS — M542 Cervicalgia: Secondary | ICD-10-CM | POA: Diagnosis not present

## 2020-03-18 DIAGNOSIS — M545 Low back pain: Secondary | ICD-10-CM | POA: Diagnosis not present

## 2020-07-02 DIAGNOSIS — F4323 Adjustment disorder with mixed anxiety and depressed mood: Secondary | ICD-10-CM | POA: Diagnosis not present

## 2020-07-12 DIAGNOSIS — F4323 Adjustment disorder with mixed anxiety and depressed mood: Secondary | ICD-10-CM | POA: Diagnosis not present

## 2020-07-21 DIAGNOSIS — R5383 Other fatigue: Secondary | ICD-10-CM | POA: Diagnosis not present

## 2020-07-26 DIAGNOSIS — F4323 Adjustment disorder with mixed anxiety and depressed mood: Secondary | ICD-10-CM | POA: Diagnosis not present

## 2020-08-12 DIAGNOSIS — F419 Anxiety disorder, unspecified: Secondary | ICD-10-CM | POA: Diagnosis not present

## 2020-08-13 DIAGNOSIS — F419 Anxiety disorder, unspecified: Secondary | ICD-10-CM | POA: Diagnosis not present

## 2020-08-16 DIAGNOSIS — Z79899 Other long term (current) drug therapy: Secondary | ICD-10-CM | POA: Diagnosis not present

## 2020-08-16 DIAGNOSIS — Z79891 Long term (current) use of opiate analgesic: Secondary | ICD-10-CM | POA: Diagnosis not present

## 2020-08-16 DIAGNOSIS — M961 Postlaminectomy syndrome, not elsewhere classified: Secondary | ICD-10-CM | POA: Diagnosis not present

## 2020-08-16 DIAGNOSIS — Z5181 Encounter for therapeutic drug level monitoring: Secondary | ICD-10-CM | POA: Diagnosis not present

## 2020-08-16 DIAGNOSIS — M542 Cervicalgia: Secondary | ICD-10-CM | POA: Diagnosis not present

## 2020-08-16 DIAGNOSIS — F419 Anxiety disorder, unspecified: Secondary | ICD-10-CM | POA: Diagnosis not present

## 2020-08-16 DIAGNOSIS — M545 Low back pain: Secondary | ICD-10-CM | POA: Diagnosis not present

## 2020-08-16 DIAGNOSIS — M5136 Other intervertebral disc degeneration, lumbar region: Secondary | ICD-10-CM | POA: Diagnosis not present

## 2020-09-01 DIAGNOSIS — F419 Anxiety disorder, unspecified: Secondary | ICD-10-CM | POA: Diagnosis not present

## 2020-09-23 DIAGNOSIS — F419 Anxiety disorder, unspecified: Secondary | ICD-10-CM | POA: Diagnosis not present

## 2020-10-04 DIAGNOSIS — G250 Essential tremor: Secondary | ICD-10-CM | POA: Diagnosis not present

## 2020-10-04 DIAGNOSIS — Z0000 Encounter for general adult medical examination without abnormal findings: Secondary | ICD-10-CM | POA: Diagnosis not present

## 2020-10-04 DIAGNOSIS — Z136 Encounter for screening for cardiovascular disorders: Secondary | ICD-10-CM | POA: Diagnosis not present

## 2020-10-04 DIAGNOSIS — R7301 Impaired fasting glucose: Secondary | ICD-10-CM | POA: Diagnosis not present

## 2020-10-04 DIAGNOSIS — M549 Dorsalgia, unspecified: Secondary | ICD-10-CM | POA: Diagnosis not present

## 2020-10-04 DIAGNOSIS — F419 Anxiety disorder, unspecified: Secondary | ICD-10-CM | POA: Diagnosis not present

## 2020-10-04 DIAGNOSIS — Z1211 Encounter for screening for malignant neoplasm of colon: Secondary | ICD-10-CM | POA: Diagnosis not present

## 2020-10-04 DIAGNOSIS — R0789 Other chest pain: Secondary | ICD-10-CM | POA: Diagnosis not present

## 2020-10-04 DIAGNOSIS — R61 Generalized hyperhidrosis: Secondary | ICD-10-CM | POA: Diagnosis not present

## 2020-10-04 DIAGNOSIS — E348 Other specified endocrine disorders: Secondary | ICD-10-CM | POA: Diagnosis not present

## 2020-10-13 ENCOUNTER — Other Ambulatory Visit: Payer: Self-pay | Admitting: Family Medicine

## 2020-10-13 DIAGNOSIS — E348 Other specified endocrine disorders: Secondary | ICD-10-CM

## 2020-10-13 DIAGNOSIS — E781 Pure hyperglyceridemia: Secondary | ICD-10-CM

## 2020-10-13 DIAGNOSIS — Z1231 Encounter for screening mammogram for malignant neoplasm of breast: Secondary | ICD-10-CM

## 2020-10-13 DIAGNOSIS — D7589 Other specified diseases of blood and blood-forming organs: Secondary | ICD-10-CM

## 2020-10-22 ENCOUNTER — Ambulatory Visit
Admission: RE | Admit: 2020-10-22 | Discharge: 2020-10-22 | Disposition: A | Discharge status: Home / Self Care | Payer: PPO | Source: Ambulatory Visit | Attending: Family Medicine | Admitting: Family Medicine

## 2020-10-22 DIAGNOSIS — D7589 Other specified diseases of blood and blood-forming organs: Secondary | ICD-10-CM

## 2020-10-22 DIAGNOSIS — E781 Pure hyperglyceridemia: Secondary | ICD-10-CM

## 2020-10-22 DIAGNOSIS — K7689 Other specified diseases of liver: Secondary | ICD-10-CM | POA: Diagnosis not present

## 2020-10-27 DIAGNOSIS — E785 Hyperlipidemia, unspecified: Secondary | ICD-10-CM | POA: Diagnosis not present

## 2020-10-27 DIAGNOSIS — K769 Liver disease, unspecified: Secondary | ICD-10-CM | POA: Diagnosis not present

## 2020-10-27 DIAGNOSIS — R0789 Other chest pain: Secondary | ICD-10-CM | POA: Diagnosis not present

## 2020-10-27 DIAGNOSIS — E781 Pure hyperglyceridemia: Secondary | ICD-10-CM | POA: Diagnosis not present

## 2020-10-27 DIAGNOSIS — E538 Deficiency of other specified B group vitamins: Secondary | ICD-10-CM | POA: Diagnosis not present

## 2020-11-24 ENCOUNTER — Telehealth: Payer: Self-pay | Admitting: Family Medicine

## 2020-11-24 NOTE — Telephone Encounter (Signed)
I called pt and left her a generic message to call PCP back. No DPR. Pt's medical records are ready for pick up at front desk.

## 2020-11-25 NOTE — Telephone Encounter (Signed)
Called pt again regarding pick up of medical records pt stated she will be in later today. Let pt know the records will be upfront.

## 2020-11-26 ENCOUNTER — Other Ambulatory Visit: Payer: Self-pay | Admitting: Family Medicine

## 2020-11-26 DIAGNOSIS — Z1231 Encounter for screening mammogram for malignant neoplasm of breast: Secondary | ICD-10-CM

## 2020-11-29 ENCOUNTER — Ambulatory Visit: Payer: PPO | Admitting: Internal Medicine

## 2020-11-29 ENCOUNTER — Encounter: Payer: Self-pay | Admitting: Internal Medicine

## 2020-11-29 ENCOUNTER — Other Ambulatory Visit: Payer: Self-pay

## 2020-11-29 VITALS — BP 122/68 | HR 63 | Ht 63.0 in | Wt 138.2 lb

## 2020-11-29 DIAGNOSIS — R011 Cardiac murmur, unspecified: Secondary | ICD-10-CM

## 2020-11-29 DIAGNOSIS — R072 Precordial pain: Secondary | ICD-10-CM

## 2020-11-29 DIAGNOSIS — E782 Mixed hyperlipidemia: Secondary | ICD-10-CM | POA: Diagnosis not present

## 2020-11-29 DIAGNOSIS — Z01812 Encounter for preprocedural laboratory examination: Secondary | ICD-10-CM | POA: Diagnosis not present
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
## 2020-11-29 NOTE — Patient Instructions (Addendum)
Medication Instructions:  Your physician recommends that you continue on your current medications as directed. Please refer to the Current Medication list given to you today.  Take metoprolol tartrate 50mg  TWO HOURS prior to CT test  *If you need a refill on your cardiac medications before your next appointment, please call your pharmacy*   Lab Work: BMET - due 1 week prior to CT test  If you have labs (blood work) drawn today and your tests are completely normal, you will receive your results only by: Marland Kitchen MyChart Message (if you have MyChart) OR . A paper copy in the mail If you have any lab test that is abnormal or we need to change your treatment, we will call you to review the results.   Testing/Procedures: Coronary CT at [place]: At [facility], you and your health needs are our priority.  As part of our continuing mission to provide you with exceptional heart care, we have created designated Provider Care Teams.  These Care Teams include your primary Cardiologist (physician) and Advanced Practice Providers (APPs -  Physician Assistants and Nurse Practitioners) who all work together to provide you with the care you need, when you need it.  We recommend signing up for the patient portal called "MyChart".  Sign up information is provided on this After Visit Summary.  MyChart is used to connect with patients for Virtual Visits (Telemedicine).  Patients are able to view lab/test results, encounter notes, upcoming appointments, etc.  Non-urgent messages can be sent to your provider as well.   To learn more about what you can do with MyChart, go to [email or web address].    Your next appointment:   1-2 month(s)  The format for your next appointment:   In Person  Provider:   You may see Dr. Debara Pickett or one of the following Advanced Practice Providers on your designated Care Team:    Almyra Deforest, PA-C  Fabian Sharp, [place] or   Roby Lofts, [place]    Other  Instructions Your cardiac CT will be scheduled at one of the below locations:   [facility] [address] [place], [place] [address] [phone]  [place] [address] [place], [place] [address] [phone]  If scheduled at [facility], please arrive at the [facility] main entrance of [facility] 30 minutes prior to test start time. Proceed to the [facility] Radiology Department (first floor) to check-in and test prep.  If scheduled at [facility], please arrive 15 mins early for check-in and test prep.  Please follow these instructions carefully (unless otherwise directed):   On the Night Before the Test: . Be sure to Drink plenty of water. . Do not consume any caffeinated/decaffeinated beverages or chocolate 12 hours prior to your test. . Do not take any antihistamines 12 hours prior to your test.  On the Day of the Test: . Drink plenty of water. Do not drink any water within one hour of the test. . Do not eat any food 4 hours prior to the test. . You may take your regular medications prior to the test.  . Take metoprolol tartrate 50mg  (Lopressor) two hours prior to test. . FEMALES- please wear underwire-free bra if available       After the Test: . Drink plenty of water. . After receiving IV contrast, you may experience a mild flushed feeling. This is normal. . On occasion, you  may experience a mild rash up to 24 hours after the test. This is not dangerous. If this occurs, you can take Benadryl 25 mg and increase your fluid intake. . If you experience trouble breathing, this can be serious. If it is severe call 911 IMMEDIATELY. If it is mild, please call our office. . If you take any of these medications: Glipizide/Metformin, Avandament, Glucavance, please do not take 48 hours after completing test unless otherwise instructed.   Once we have confirmed  authorization from your insurance company, we will call you to set up a date and time for your test. Based on how quickly your insurance processes prior authorizations requests, please allow up to 4 weeks to be contacted for scheduling your Cardiac CT appointment. Be advised that routine Cardiac CT appointments could be scheduled as many as 8 weeks after your provider has ordered it.  For non-scheduling related questions, please contact the cardiac imaging nurse navigator should you have any questions/concerns: Marchia Bond, Cardiac Imaging Nurse Navigator Burley Saver, Interim Cardiac Imaging Nurse [place] [facility] Direct Office Dial: [phone]   For scheduling needs, including cancellations and rescheduling, please call [place], [phone].

## 2020-11-30 LAB — BASIC METABOLIC PANEL
BUN/Creatinine Ratio: 16 (ref 12–28)
BUN: 14 mg/dL (ref 8–27)
CO2: 24 mmol/L (ref 20–29)
Calcium: 10.1 mg/dL (ref 8.7–10.3)
Chloride: 99 mmol/L (ref 96–106)
Creatinine, Ser: 0.87 mg/dL (ref 0.57–1.00)
GFR calc Af Amer: 80 mL/min/{1.73_m2} (ref >59)
GFR calc non Af Amer: 69 mL/min/{1.73_m2} (ref >59)
Glucose: 85 mg/dL (ref 65–99)
Potassium: 4.9 mmol/L (ref 3.5–5.2)
Sodium: 140 mmol/L (ref 134–144)

## 2020-12-01 ENCOUNTER — Telehealth: Payer: Self-pay | Admitting: Family Medicine

## 2020-12-01 NOTE — Telephone Encounter (Signed)
Pt picked up medical records and she has gone through  every single page and is looking 4 colored photographs  That would have been taking in 2008 .patient is looking for hand written notes from Dr. Merla Riches and others   Please advise   And quality of copies was poor

## 2020-12-06 ENCOUNTER — Telehealth (HOSPITAL_COMMUNITY): Payer: Self-pay | Admitting: Emergency Medicine

## 2020-12-06 DIAGNOSIS — F419 Anxiety disorder, unspecified: Secondary | ICD-10-CM | POA: Diagnosis not present

## 2020-12-06 NOTE — Telephone Encounter (Signed)
Reaching out to patient to offer assistance regarding upcoming cardiac imaging study; pt verbalizes understanding of appt date/time, parking situation and where to check in, pre-test NPO status and medications ordered, and verified current allergies; name and call back number provided for further questions should they arise Carmen Alexandria RN Navigator Cardiac Imaging [facility] [phone] office [phone] cell  Pt to take 50mg  metoprolol 2 hr prior to scan Carmen Rasmussen

## 2020-12-06 NOTE — Telephone Encounter (Signed)
Attempted to call patient regarding upcoming cardiac CT appointment. °Left message on voicemail with name and callback number °Reis Pienta RN Navigator Cardiac Imaging °[place] [facility] °[phone] Office °[phone] Cell ° °

## 2020-12-06 NOTE — Telephone Encounter (Signed)
Attempted to call patient regarding upcoming cardiac CT appointment. °Left message on voicemail with name and callback number °Dhruvi Crenshaw RN Navigator Cardiac Imaging °[place] °[phone] Office °[phone] Cell ° °

## 2020-12-07 ENCOUNTER — Other Ambulatory Visit: Payer: Self-pay

## 2020-12-07 ENCOUNTER — Ambulatory Visit (HOSPITAL_COMMUNITY)
Admission: RE | Admit: 2020-12-07 | Discharge: 2020-12-07 | Disposition: A | Discharge status: Home / Self Care | Payer: PPO | Source: Ambulatory Visit | Attending: Internal Medicine | Admitting: Internal Medicine

## 2020-12-07 ENCOUNTER — Telehealth (HOSPITAL_COMMUNITY): Payer: Self-pay | Admitting: Emergency Medicine

## 2020-12-07 DIAGNOSIS — R072 Precordial pain: Secondary | ICD-10-CM

## 2020-12-07 MED ORDER — NITROGLYCERIN 0.4 MG SL SUBL
SUBLINGUAL_TABLET | SUBLINGUAL | Status: AC
  Administered 2020-12-07: 0.8 mg via SUBLINGUAL
  Filled 2020-12-07: qty 2

## 2020-12-07 MED ORDER — NITROGLYCERIN 0.4 MG SL SUBL: 0.8000 mg | SUBLINGUAL_TABLET | SUBLINGUAL | Status: DC | PRN

## 2020-12-07 MED ORDER — IOHEXOL 350 MG/ML SOLN
80.0000 mL | Freq: Once | INTRAVENOUS | Status: AC | PRN
  Administered 2020-12-07: 80 mL via INTRAVENOUS

## 2020-12-07 NOTE — Telephone Encounter (Signed)
I called pt and let her know the photos from her paper chart are ready for pickup.

## 2020-12-07 NOTE — Telephone Encounter (Signed)
Reaching out to patient to offer assistance regarding upcoming cardiac imaging study; pt verbalizes understanding of appt date/time, parking situation and where to check in, pre-test NPO status and medications ordered, and verified current allergies; name and call back number provided for further questions should they arise [name] RN Navigator Cardiac Imaging [facility] [phone] office [phone] cell  Pt reminded to take metoprolol tartrate 2 hr prior to scan  [place]

## 2020-12-07 NOTE — Telephone Encounter (Signed)
I called pt and let her know the photos from her paper chart are ready for pickup.  

## 2020-12-09 ENCOUNTER — Other Ambulatory Visit: Payer: Self-pay | Admitting: *Deleted

## 2020-12-09 DIAGNOSIS — E782 Mixed hyperlipidemia: Secondary | ICD-10-CM

## 2020-12-09 MED ORDER — ROSUVASTATIN CALCIUM 40 MG PO TABS
40.0000 mg | ORAL_TABLET | Freq: Every day | ORAL | 3 refills | Status: DC
Start: 2020-12-09 — End: 2021-01-27

## 2020-12-28 DIAGNOSIS — Z79891 Long term (current) use of opiate analgesic: Secondary | ICD-10-CM | POA: Diagnosis not present

## 2020-12-28 DIAGNOSIS — M961 Postlaminectomy syndrome, not elsewhere classified: Secondary | ICD-10-CM | POA: Diagnosis not present

## 2020-12-28 DIAGNOSIS — M542 Cervicalgia: Secondary | ICD-10-CM | POA: Diagnosis not present

## 2020-12-28 DIAGNOSIS — M5459 Other low back pain: Secondary | ICD-10-CM | POA: Diagnosis not present

## 2020-12-28 DIAGNOSIS — M5136 Other intervertebral disc degeneration, lumbar region: Secondary | ICD-10-CM | POA: Diagnosis not present

## 2021-01-04 DIAGNOSIS — F419 Anxiety disorder, unspecified: Secondary | ICD-10-CM | POA: Diagnosis not present

## 2021-01-19 DIAGNOSIS — F419 Anxiety disorder, unspecified: Secondary | ICD-10-CM | POA: Diagnosis not present

## 2021-01-19 DIAGNOSIS — F4323 Adjustment disorder with mixed anxiety and depressed mood: Secondary | ICD-10-CM | POA: Diagnosis not present

## 2021-01-25 ENCOUNTER — Ambulatory Visit: Payer: PPO

## 2021-01-26 DIAGNOSIS — E782 Mixed hyperlipidemia: Secondary | ICD-10-CM | POA: Diagnosis not present

## 2021-01-27 ENCOUNTER — Other Ambulatory Visit: Payer: Self-pay

## 2021-01-27 ENCOUNTER — Encounter: Payer: Self-pay | Admitting: Internal Medicine

## 2021-01-27 ENCOUNTER — Ambulatory Visit: Payer: PPO | Admitting: Internal Medicine

## 2021-01-27 VITALS — BP 121/66 | HR 71 | Ht 63.5 in | Wt 138.0 lb

## 2021-01-27 DIAGNOSIS — R0789 Other chest pain: Secondary | ICD-10-CM

## 2021-01-27 DIAGNOSIS — E782 Mixed hyperlipidemia: Secondary | ICD-10-CM

## 2021-01-27 DIAGNOSIS — R931 Abnormal findings on diagnostic imaging of heart and coronary circulation: Secondary | ICD-10-CM | POA: Diagnosis not present

## 2021-01-27 DIAGNOSIS — R011 Cardiac murmur, unspecified: Secondary | ICD-10-CM | POA: Diagnosis not present

## 2021-01-27 LAB — LIPID PANEL
Chol/HDL Ratio: 2.3 ratio (ref 0.0–4.4)
Cholesterol, Total: 211 mg/dL — ABNORMAL HIGH (ref 100–199)
HDL: 91 mg/dL (ref >39)
LDL Chol Calc (NIH): 87 mg/dL (ref 0–99)
Triglycerides: 205 mg/dL — ABNORMAL HIGH (ref 0–149)
VLDL Cholesterol Cal: 33 mg/dL (ref 5–40)

## 2021-01-27 MED ORDER — EZETIMIBE 10 MG PO TABS: 10.0000 mg | ORAL_TABLET | Freq: Every day | ORAL | 3 refills | Status: DC

## 2021-01-27 MED ORDER — ROSUVASTATIN CALCIUM 20 MG PO TABS
20.0000 mg | ORAL_TABLET | Freq: Every day | ORAL | 3 refills | Status: DC
Start: 2021-01-27 — End: 2022-02-06
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
## 2021-01-27 NOTE — Patient Instructions (Signed)
Medication Instructions:  TAKE crestor 20mg  daily ADD zetia 10mg  daily to your current regimen Continue all other current medications *If you need a refill on your cardiac medications before your next appointment, please call your pharmacy*   Lab Work: FASTING lipid panel in 3 months  If you have labs (blood work) drawn today and your tests are completely normal, you will receive your results only by: MyChart Message (if you have MyChart) OR . A paper copy in the mail If you have any lab test that is abnormal or we need to change your treatment, we will call you to review the results.   Testing/Procedures: NONE   Follow-Up: At [facility], you and your health needs are our priority.  As part of our continuing mission to provide you with exceptional heart care, we have created designated Provider Care Teams.  These Care Teams include your primary Cardiologist (physician) and Advanced Practice Providers (APPs -  Physician Assistants and Nurse Practitioners) who all work together to provide you with the care you need, when you need it.  We recommend signing up for the patient portal called "MyChart".  Sign up information is provided on this After Visit Summary.  MyChart is used to connect with patients for Virtual Visits (Telemedicine).  Patients are able to view lab/test results, encounter notes, upcoming appointments, etc.  Non-urgent messages can be sent to your provider as well.   To learn more about what you can do with MyChart, go to Marland Kitchen.    Your next appointment:   3 month(s)  The format for your next appointment:   In Person  Provider:   You may see Dr. [facility] or one of the following Advanced Practice Providers on your designated Care Team:    [email or web address], PA-C  Rennis Golden, Azalee Course or   Micah Flesher, [place]    Other Instructions

## 2021-02-22 DIAGNOSIS — F419 Anxiety disorder, unspecified: Secondary | ICD-10-CM | POA: Diagnosis not present

## 2021-02-22 DIAGNOSIS — F4323 Adjustment disorder with mixed anxiety and depressed mood: Secondary | ICD-10-CM | POA: Diagnosis not present

## 2021-03-04 ENCOUNTER — Other Ambulatory Visit: Payer: PPO

## 2021-03-09 DIAGNOSIS — F419 Anxiety disorder, unspecified: Secondary | ICD-10-CM | POA: Diagnosis not present

## 2021-03-09 DIAGNOSIS — F4323 Adjustment disorder with mixed anxiety and depressed mood: Secondary | ICD-10-CM | POA: Diagnosis not present

## 2021-03-22 DIAGNOSIS — M5136 Other intervertebral disc degeneration, lumbar region: Secondary | ICD-10-CM | POA: Diagnosis not present

## 2021-04-01 ENCOUNTER — Telehealth: Payer: Self-pay | Admitting: Internal Medicine

## 2021-04-01 NOTE — Telephone Encounter (Signed)
Patient was returning phone call that was from he mychart message. Please advise

## 2021-04-01 NOTE — Telephone Encounter (Signed)
I called the pt and did not realize that she spoke with [place] earlier today.. she is not sure that she will go to the ED.Marland Kitchen she will continue to monitor her symptoms and if they worsen she will consider going to the ED.

## 2021-04-01 NOTE — Telephone Encounter (Signed)
Spoke with patient who reports chest pressure - states this is off and on. She said she gets concerned if this symptom lasts more than 24 hours, which prompted her to reach out to our office. She was told by her sister it could be anxiety. She said she had a rough night last night - exhausted, sweaty, couldn't rest. She reports fatigue. She also reports feeling nausea, like she could vomit which is unusual for her. She took a COVID test yesterday - negative. She took 2 chewable ASA a few nights ago.   Triage RN advised her to go to ED on 4/7. This RN and Dr. Rennis Golden advised that she go to ED for work-up of acute symptoms. Patient states she is NOT going to ED at this time. Will consider it if her symptoms recur.    Cardiac CT done 11/2020: IMPRESSION: 1. Minimal to mild mixed non-obstructive CAD, CADRADS = 2.  2. Coronary calcium score of 381. This was 92nd percentile for age and sex matched control.  3. Normal coronary origin with right dominance.  4. Aggressive secondary prevention (risk factor modification) is recommended.

## 2021-04-01 NOTE — Telephone Encounter (Signed)
Spoke to patient she stated she has been having chest pressure off and on for several weeks.She feels tired,not her self.Stated she feels better this morning.Stated she has appointment with Dr.Hilty 5/4 at 1:30 pm.She would like to see him sooner if possible.He family is leaving for vacation 5/3.Advised I will send message to Dr.Hilty's RN.

## 2021-04-04 DIAGNOSIS — R0789 Other chest pain: Secondary | ICD-10-CM | POA: Diagnosis not present

## 2021-04-27 ENCOUNTER — Ambulatory Visit: Payer: PPO | Admitting: Internal Medicine

## 2021-05-04 DIAGNOSIS — F4323 Adjustment disorder with mixed anxiety and depressed mood: Secondary | ICD-10-CM | POA: Diagnosis not present

## 2021-05-04 DIAGNOSIS — F419 Anxiety disorder, unspecified: Secondary | ICD-10-CM | POA: Diagnosis not present

## 2021-05-17 DIAGNOSIS — M5459 Other low back pain: Secondary | ICD-10-CM | POA: Diagnosis not present

## 2021-05-17 DIAGNOSIS — Z79899 Other long term (current) drug therapy: Secondary | ICD-10-CM | POA: Diagnosis not present

## 2021-05-17 DIAGNOSIS — M5136 Other intervertebral disc degeneration, lumbar region: Secondary | ICD-10-CM | POA: Diagnosis not present

## 2021-05-17 DIAGNOSIS — M542 Cervicalgia: Secondary | ICD-10-CM | POA: Diagnosis not present

## 2021-05-17 DIAGNOSIS — Z79891 Long term (current) use of opiate analgesic: Secondary | ICD-10-CM | POA: Diagnosis not present

## 2021-05-17 DIAGNOSIS — Z5181 Encounter for therapeutic drug level monitoring: Secondary | ICD-10-CM | POA: Diagnosis not present

## 2021-05-24 DIAGNOSIS — G250 Essential tremor: Secondary | ICD-10-CM | POA: Diagnosis not present

## 2021-05-24 DIAGNOSIS — E785 Hyperlipidemia, unspecified: Secondary | ICD-10-CM | POA: Diagnosis not present

## 2021-05-30 DIAGNOSIS — F4323 Adjustment disorder with mixed anxiety and depressed mood: Secondary | ICD-10-CM | POA: Diagnosis not present

## 2021-05-30 DIAGNOSIS — F419 Anxiety disorder, unspecified: Secondary | ICD-10-CM | POA: Diagnosis not present

## 2021-06-17 DIAGNOSIS — F4323 Adjustment disorder with mixed anxiety and depressed mood: Secondary | ICD-10-CM | POA: Diagnosis not present

## 2021-06-17 DIAGNOSIS — F419 Anxiety disorder, unspecified: Secondary | ICD-10-CM | POA: Diagnosis not present

## 2021-07-21 DIAGNOSIS — F4323 Adjustment disorder with mixed anxiety and depressed mood: Secondary | ICD-10-CM | POA: Diagnosis not present

## 2021-07-21 DIAGNOSIS — F419 Anxiety disorder, unspecified: Secondary | ICD-10-CM | POA: Diagnosis not present

## 2021-08-01 ENCOUNTER — Other Ambulatory Visit: Payer: Self-pay | Admitting: Family Medicine

## 2021-08-01 DIAGNOSIS — Z1231 Encounter for screening mammogram for malignant neoplasm of breast: Secondary | ICD-10-CM

## 2021-08-01 DIAGNOSIS — E2839 Other primary ovarian failure: Secondary | ICD-10-CM

## 2021-08-04 ENCOUNTER — Other Ambulatory Visit: Payer: PPO

## 2021-08-04 ENCOUNTER — Ambulatory Visit: Payer: PPO

## 2021-08-15 DIAGNOSIS — F331 Major depressive disorder, recurrent, moderate: Secondary | ICD-10-CM | POA: Diagnosis not present

## 2021-08-15 DIAGNOSIS — F419 Anxiety disorder, unspecified: Secondary | ICD-10-CM | POA: Diagnosis not present

## 2021-08-19 DIAGNOSIS — F331 Major depressive disorder, recurrent, moderate: Secondary | ICD-10-CM | POA: Diagnosis not present

## 2021-08-19 DIAGNOSIS — F419 Anxiety disorder, unspecified: Secondary | ICD-10-CM | POA: Diagnosis not present

## 2021-09-09 ENCOUNTER — Other Ambulatory Visit: Payer: Self-pay

## 2021-09-09 ENCOUNTER — Ambulatory Visit: Payer: PPO | Admitting: Internal Medicine

## 2021-09-09 ENCOUNTER — Encounter: Payer: Self-pay | Admitting: Internal Medicine

## 2021-09-09 VITALS — BP 124/64 | HR 72 | Ht 63.5 in | Wt 143.8 lb

## 2021-09-09 DIAGNOSIS — E782 Mixed hyperlipidemia: Secondary | ICD-10-CM | POA: Diagnosis not present

## 2021-09-09 DIAGNOSIS — R011 Cardiac murmur, unspecified: Secondary | ICD-10-CM

## 2021-09-09 DIAGNOSIS — R931 Abnormal findings on diagnostic imaging of heart and coronary circulation: Secondary | ICD-10-CM | POA: Diagnosis not present

## 2021-09-09 DIAGNOSIS — R0789 Other chest pain: Secondary | ICD-10-CM | POA: Diagnosis not present

## 2021-09-09 NOTE — Patient Instructions (Signed)
Medication Instructions:  Your physician recommends that you continue on your current medications as directed. Please refer to the Current Medication list given to you today.  *If you need a refill on your cardiac medications before your next appointment, please call your pharmacy*   Lab Work: LIPID PANEL today   If you have labs (blood work) drawn today and your tests are completely normal, you will receive your results only by: MyChart Message (if you have MyChart) OR A paper copy in the mail If you have any lab test that is abnormal or we need to change your treatment, we will call you to review the results.  Follow-Up: At [facility], you and your health needs are our priority.  As part of our continuing mission to provide you with exceptional heart care, we have created designated Provider Care Teams.  These Care Teams include your primary Cardiologist (physician) and Advanced Practice Providers (APPs -  Physician Assistants and Nurse Practitioners) who all work together to provide you with the care you need, when you need it.  We recommend signing up for the patient portal called "MyChart".  Sign up information is provided on this After Visit Summary.  MyChart is used to connect with patients for Virtual Visits (Telemedicine).  Patients are able to view lab/test results, encounter notes, upcoming appointments, etc.  Non-urgent messages can be sent to your provider as well.   To learn more about what you can do with MyChart, go to [email or web address].    Your next appointment:   12 month(s)  The format for your next appointment:   In Person  Provider:   You may see Dr. Rennis Golden or one of the following Advanced Practice Providers on your designated Care Team:   Azalee Course, PA-C Micah Flesher, PA-C or  Judy Pimple, [place]
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
## 2021-09-10 LAB — LIPID PANEL
Chol/HDL Ratio: 2 ratio (ref 0.0–4.4)
Cholesterol, Total: 167 mg/dL (ref 100–199)
HDL: 82 mg/dL (ref >39)
LDL Chol Calc (NIH): 49 mg/dL (ref 0–99)
Triglycerides: 237 mg/dL — ABNORMAL HIGH (ref 0–149)
VLDL Cholesterol Cal: 36 mg/dL (ref 5–40)

## 2021-09-19 DIAGNOSIS — F331 Major depressive disorder, recurrent, moderate: Secondary | ICD-10-CM | POA: Diagnosis not present

## 2021-09-19 DIAGNOSIS — F419 Anxiety disorder, unspecified: Secondary | ICD-10-CM | POA: Diagnosis not present

## 2021-10-22 DIAGNOSIS — M5136 Other intervertebral disc degeneration, lumbar region: Secondary | ICD-10-CM | POA: Diagnosis not present

## 2021-10-24 DIAGNOSIS — R52 Pain, unspecified: Secondary | ICD-10-CM | POA: Diagnosis not present

## 2021-10-24 DIAGNOSIS — R6883 Chills (without fever): Secondary | ICD-10-CM | POA: Diagnosis not present

## 2021-10-24 DIAGNOSIS — Z6824 Body mass index (BMI) 24.0-24.9, adult: Secondary | ICD-10-CM | POA: Diagnosis not present

## 2021-10-24 DIAGNOSIS — U071 COVID-19: Secondary | ICD-10-CM | POA: Diagnosis not present

## 2021-10-24 DIAGNOSIS — R509 Fever, unspecified: Secondary | ICD-10-CM | POA: Diagnosis not present

## 2021-10-27 DIAGNOSIS — F331 Major depressive disorder, recurrent, moderate: Secondary | ICD-10-CM | POA: Diagnosis not present

## 2021-10-27 DIAGNOSIS — F419 Anxiety disorder, unspecified: Secondary | ICD-10-CM | POA: Diagnosis not present

## 2021-11-01 DIAGNOSIS — Z0000 Encounter for general adult medical examination without abnormal findings: Secondary | ICD-10-CM | POA: Diagnosis not present

## 2021-11-01 DIAGNOSIS — E785 Hyperlipidemia, unspecified: Secondary | ICD-10-CM | POA: Diagnosis not present

## 2021-11-01 DIAGNOSIS — G250 Essential tremor: Secondary | ICD-10-CM | POA: Diagnosis not present

## 2021-11-01 DIAGNOSIS — D649 Anemia, unspecified: Secondary | ICD-10-CM | POA: Diagnosis not present

## 2021-11-01 DIAGNOSIS — D7589 Other specified diseases of blood and blood-forming organs: Secondary | ICD-10-CM | POA: Diagnosis not present

## 2021-11-01 DIAGNOSIS — F419 Anxiety disorder, unspecified: Secondary | ICD-10-CM | POA: Diagnosis not present

## 2021-11-01 DIAGNOSIS — Z23 Encounter for immunization: Secondary | ICD-10-CM | POA: Diagnosis not present

## 2021-11-01 DIAGNOSIS — K769 Liver disease, unspecified: Secondary | ICD-10-CM | POA: Diagnosis not present

## 2021-11-23 DIAGNOSIS — J029 Acute pharyngitis, unspecified: Secondary | ICD-10-CM | POA: Diagnosis not present

## 2021-11-24 DIAGNOSIS — Z03818 Encounter for observation for suspected exposure to other biological agents ruled out: Secondary | ICD-10-CM | POA: Diagnosis not present

## 2021-11-24 DIAGNOSIS — J029 Acute pharyngitis, unspecified: Secondary | ICD-10-CM | POA: Diagnosis not present

## 2021-12-14 DIAGNOSIS — M25512 Pain in left shoulder: Secondary | ICD-10-CM | POA: Diagnosis not present

## 2021-12-14 DIAGNOSIS — M79642 Pain in left hand: Secondary | ICD-10-CM | POA: Diagnosis not present

## 2021-12-14 DIAGNOSIS — M5412 Radiculopathy, cervical region: Secondary | ICD-10-CM | POA: Diagnosis not present

## 2021-12-28 DIAGNOSIS — M5136 Other intervertebral disc degeneration, lumbar region: Secondary | ICD-10-CM | POA: Diagnosis not present

## 2021-12-28 DIAGNOSIS — M542 Cervicalgia: Secondary | ICD-10-CM | POA: Diagnosis not present

## 2021-12-28 DIAGNOSIS — M961 Postlaminectomy syndrome, not elsewhere classified: Secondary | ICD-10-CM | POA: Diagnosis not present

## 2021-12-28 DIAGNOSIS — Z79891 Long term (current) use of opiate analgesic: Secondary | ICD-10-CM | POA: Diagnosis not present

## 2021-12-28 DIAGNOSIS — M5459 Other low back pain: Secondary | ICD-10-CM | POA: Diagnosis not present

## 2022-01-02 DIAGNOSIS — F331 Major depressive disorder, recurrent, moderate: Secondary | ICD-10-CM | POA: Diagnosis not present

## 2022-01-02 DIAGNOSIS — F419 Anxiety disorder, unspecified: Secondary | ICD-10-CM | POA: Diagnosis not present

## 2022-01-08 DIAGNOSIS — M542 Cervicalgia: Secondary | ICD-10-CM | POA: Diagnosis not present

## 2022-01-11 DIAGNOSIS — M961 Postlaminectomy syndrome, not elsewhere classified: Secondary | ICD-10-CM | POA: Diagnosis not present

## 2022-01-11 DIAGNOSIS — M5459 Other low back pain: Secondary | ICD-10-CM | POA: Diagnosis not present

## 2022-01-11 DIAGNOSIS — Z79891 Long term (current) use of opiate analgesic: Secondary | ICD-10-CM | POA: Diagnosis not present

## 2022-01-11 DIAGNOSIS — M542 Cervicalgia: Secondary | ICD-10-CM | POA: Diagnosis not present

## 2022-01-20 ENCOUNTER — Ambulatory Visit
Admission: RE | Admit: 2022-01-20 | Discharge: 2022-01-20 | Disposition: A | Discharge status: Home / Self Care | Payer: PPO | Source: Ambulatory Visit | Attending: Family Medicine | Admitting: Family Medicine

## 2022-01-20 ENCOUNTER — Other Ambulatory Visit: Payer: Self-pay | Admitting: Family Medicine

## 2022-01-20 DIAGNOSIS — M85832 Other specified disorders of bone density and structure, left forearm: Secondary | ICD-10-CM | POA: Diagnosis not present

## 2022-01-20 DIAGNOSIS — Z1231 Encounter for screening mammogram for malignant neoplasm of breast: Secondary | ICD-10-CM | POA: Diagnosis not present

## 2022-01-20 DIAGNOSIS — E2839 Other primary ovarian failure: Secondary | ICD-10-CM

## 2022-01-20 DIAGNOSIS — M810 Age-related osteoporosis without current pathological fracture: Secondary | ICD-10-CM | POA: Diagnosis not present

## 2022-01-20 DIAGNOSIS — Z780 Asymptomatic menopausal state: Secondary | ICD-10-CM | POA: Diagnosis not present

## 2022-01-23 DIAGNOSIS — M542 Cervicalgia: Secondary | ICD-10-CM | POA: Diagnosis not present

## 2022-01-23 DIAGNOSIS — G5600 Carpal tunnel syndrome, unspecified upper limb: Secondary | ICD-10-CM | POA: Diagnosis not present

## 2022-01-23 DIAGNOSIS — M5451 Vertebrogenic low back pain: Secondary | ICD-10-CM | POA: Diagnosis not present

## 2022-02-01 DIAGNOSIS — M7542 Impingement syndrome of left shoulder: Secondary | ICD-10-CM | POA: Diagnosis not present

## 2022-02-01 DIAGNOSIS — M25512 Pain in left shoulder: Secondary | ICD-10-CM | POA: Diagnosis not present

## 2022-02-06 ENCOUNTER — Other Ambulatory Visit: Payer: Self-pay

## 2022-02-06 MED ORDER — ROSUVASTATIN CALCIUM 20 MG PO TABS: 20.0000 mg | ORAL_TABLET | Freq: Every day | ORAL | 3 refills | Status: DC

## 2022-02-15 DIAGNOSIS — G5602 Carpal tunnel syndrome, left upper limb: Secondary | ICD-10-CM | POA: Diagnosis not present

## 2022-02-15 DIAGNOSIS — M5412 Radiculopathy, cervical region: Secondary | ICD-10-CM | POA: Diagnosis not present

## 2022-02-23 DIAGNOSIS — F419 Anxiety disorder, unspecified: Secondary | ICD-10-CM | POA: Diagnosis not present

## 2022-02-23 DIAGNOSIS — F330 Major depressive disorder, recurrent, mild: Secondary | ICD-10-CM | POA: Diagnosis not present

## 2022-03-08 DIAGNOSIS — G8929 Other chronic pain: Secondary | ICD-10-CM | POA: Diagnosis not present

## 2022-03-13 DIAGNOSIS — M5412 Radiculopathy, cervical region: Secondary | ICD-10-CM | POA: Diagnosis not present

## 2022-03-13 DIAGNOSIS — M5030 Other cervical disc degeneration, unspecified cervical region: Secondary | ICD-10-CM | POA: Diagnosis not present

## 2022-03-13 DIAGNOSIS — M47812 Spondylosis without myelopathy or radiculopathy, cervical region: Secondary | ICD-10-CM | POA: Diagnosis not present

## 2022-03-15 DIAGNOSIS — M5136 Other intervertebral disc degeneration, lumbar region: Secondary | ICD-10-CM | POA: Diagnosis not present

## 2022-03-15 DIAGNOSIS — M48061 Spinal stenosis, lumbar region without neurogenic claudication: Secondary | ICD-10-CM | POA: Diagnosis not present

## 2022-03-15 DIAGNOSIS — M5412 Radiculopathy, cervical region: Secondary | ICD-10-CM | POA: Diagnosis not present

## 2022-03-15 DIAGNOSIS — M5459 Other low back pain: Secondary | ICD-10-CM | POA: Diagnosis not present

## 2022-03-15 DIAGNOSIS — M961 Postlaminectomy syndrome, not elsewhere classified: Secondary | ICD-10-CM | POA: Diagnosis not present

## 2022-03-15 DIAGNOSIS — Z79891 Long term (current) use of opiate analgesic: Secondary | ICD-10-CM | POA: Diagnosis not present

## 2022-03-22 DIAGNOSIS — F330 Major depressive disorder, recurrent, mild: Secondary | ICD-10-CM | POA: Diagnosis not present

## 2022-03-22 DIAGNOSIS — F419 Anxiety disorder, unspecified: Secondary | ICD-10-CM | POA: Diagnosis not present

## 2022-04-19 ENCOUNTER — Encounter: Payer: Self-pay | Admitting: *Deleted

## 2022-04-19 DIAGNOSIS — Z006 Encounter for examination for normal comparison and control in clinical research program: Secondary | ICD-10-CM

## 2022-04-19 NOTE — Research (Signed)
Message left to inform Ms. Haydon about [employer] study. Encouraged her to cal me back.  ?

## 2022-04-19 NOTE — Research (Unsigned)
Spoke with Carmen Rasmussen about Haematologist. She states that she is interested in receiving more information. Emailed her a copy of the consent to review. Encouraged her to call me back. ?

## 2022-04-20 ENCOUNTER — Encounter: Payer: Self-pay | Admitting: *Deleted

## 2022-04-20 DIAGNOSIS — Z006 Encounter for examination for normal comparison and control in clinical research program: Secondary | ICD-10-CM

## 2022-04-20 NOTE — Research (Signed)
I called to follow-up on the information about the [employer] Study which looks at at a drug to lower triglycerides. I left message for patient to call me if he has any questions about the study or would like to set up a screening appointment. ?

## 2022-04-24 DIAGNOSIS — F419 Anxiety disorder, unspecified: Secondary | ICD-10-CM | POA: Diagnosis not present

## 2022-04-24 DIAGNOSIS — F330 Major depressive disorder, recurrent, mild: Secondary | ICD-10-CM | POA: Diagnosis not present

## 2022-05-01 IMAGING — CT CT HEART MORP W/ CTA COR W/ SCORE W/ CA W/CM &/OR W/O CM
3 of 9 series · 6 of 20 positions shown, 7 images · IV contrast (APPLIED)
Comparison: None.
COMPARISON: None.

Addendum:
EXAM:
OVER-READ INTERPRETATION  CT CHEST

The following report is an over-read performed by radiologist Dr.
Dellai Isabella [[email or web address]] on 12/07/2020. This
over-read does not include interpretation of cardiac or coronary
anatomy or pathology. The coronary CTA interpretation by the
cardiologist is attached.
HISTORY: 69 yo female with chest pain, nonspecific
Cardiac/Coronary CTA
TECHNIQUE: The patient was scanned on a Siemens Force scanner.
PROTOCOL: A 120 kV prospective scan was triggered in the descending thoracic
aorta at 111 HU's. Axial non-contrast 3 mm slices were carried out
through the heart. The data set was analyzed on a dedicated work
station and scored using the Agatson method. Gantry rotation speed
was 250 msecs and collimation was .6 mm. Beta blockade and 0.8 mg of
sl NTG was given. The 3D data set was reconstructed in 5% intervals
of the 67-82 % of the R-R cycle. Diastolic phases were analyzed on a
dedicated work station using MPR, MIP and VRT modes. The patient
received 80mL OMNIPAQUE IOHEXOL 350 MG/ML SOLN of contrast.

[Series 7: best syst · axial · 0.40mm/px · z∈[+1141,+1187]mm · 2 of 343 slices shown, 3 images]
[im 115/343  vessel]
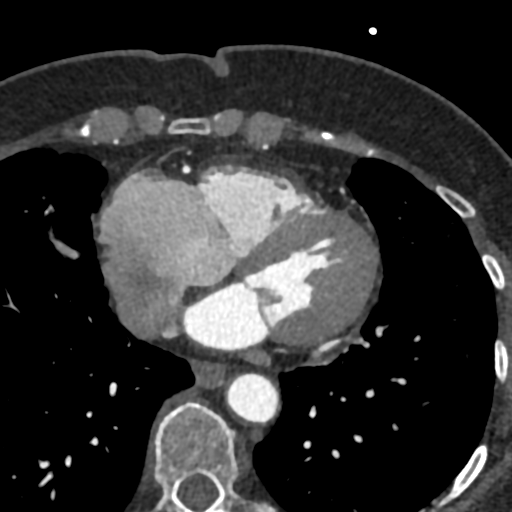
[im 115/343  lung]
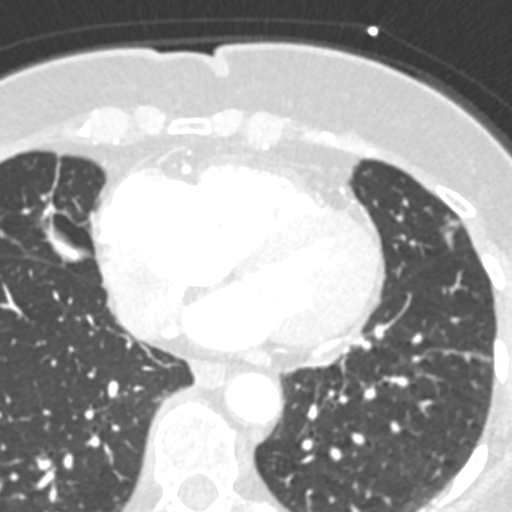
[im 229/343  vessel]
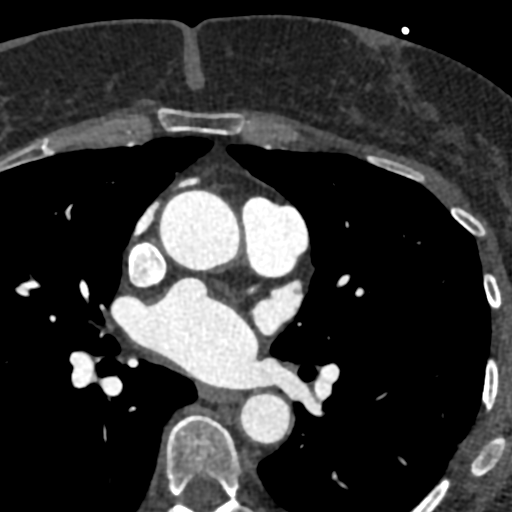

[Series 8: ts diast sharp 72 % · axial · 0.40mm/px · z∈[+1141,+1187]mm · 2 of 343 slices shown]
[im 115/343  lung]
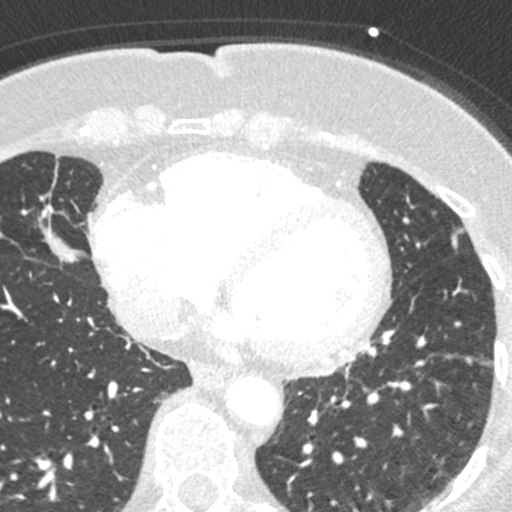
[im 229/343  lung]
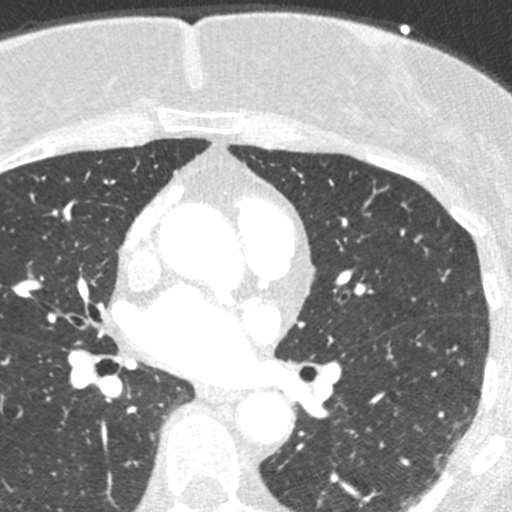

[Series 9: ts syst sharp · axial · 0.40mm/px · z∈[+1141,+1187]mm · 2 of 343 slices shown]
[im 115/343  lung]
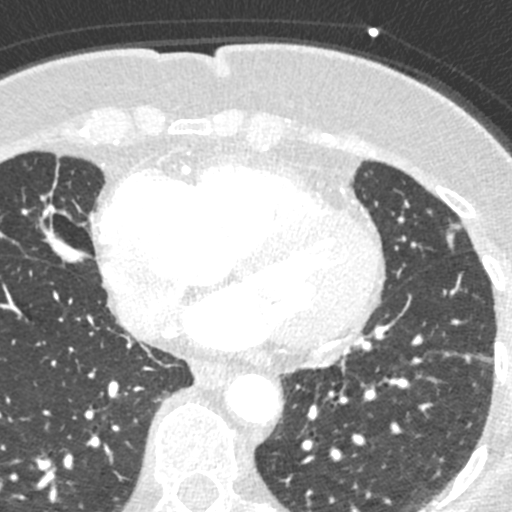
[im 229/343  lung]
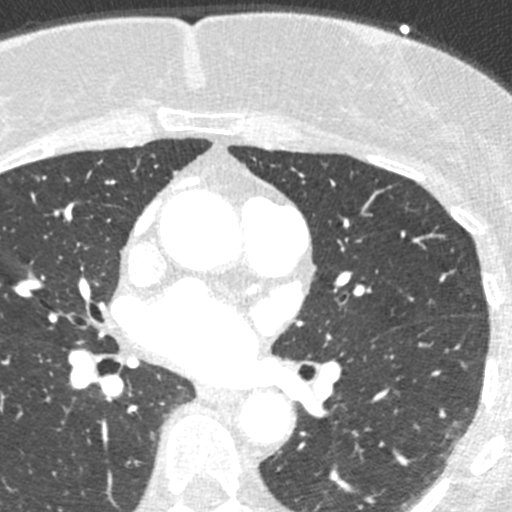

[6 of 20 positions shown; findings below may reference images not displayed]

FINDINGS: Vascular: No significant vascular findings. Normal heart size. No
pericardial effusion.

Mediastinum/Nodes: Visualized mediastinum and hilar regions
demonstrate no lymphadenopathy or masses.

Lungs/Pleura: Mild pulmonary scarring in the inferior right middle
lobe and at the left lung base. Visualized lungs show no evidence of
pulmonary edema, consolidation, pneumothorax, nodule or pleural
fluid.

Upper Abdomen: No acute abnormality.

Musculoskeletal: No chest wall mass or suspicious bone lesions
identified.
IMPRESSION: No significant noncardiac findings. Mild pulmonary scarring in the
inferior right middle lobe and at the left lung base.
FINDINGS: Quality: Excellent, HR 57

Coronary calcium score: The patient's coronary artery calcium score
is 381, which places the patient in the 92nd percentile.

Coronary arteries: Normal coronary origins.  Right dominance.

Right Coronary Artery: Dominant. Mild 25-50% mixed proximal and
mid-vessel stenoses ([id number]). Normal R-PDA and R-PLB branches.

Left Main Coronary Artery: Normal. Bifurcates into the LAD and LCx
arteries.

Left Anterior Descending Coronary Artery: Large vessel that reaches
the apex. There is minimal proximal 1-24% mixed stenoses ([id number]).
Mild 25-49% mixed mid-LAD stenosis ([id number]).

Left Circumflex Artery: Lateral branch with minimal 1-24% proximal
mixed stenosis ([id number]). Small proximal OM1 branch without
stenosis.

Aorta: Normal size, 33 mm at the mid ascending aorta (level of the
PA bifurcation) measured double oblique. Aortic atherosclerosis. No
dissection.

Aortic Valve: Trileaflet.  No calcifications.

Other findings:

Normal pulmonary vein drainage into the left atrium.

Normal left atrial appendage without a thrombus.

Normal size of the pulmonary artery.
IMPRESSION: 1. Minimal to mild mixed non-obstructive CAD, CADRADS = 2.

2. Coronary calcium score of 381. This was 92nd percentile for age
and sex matched control.

3. Normal coronary origin with right dominance.

4. Aggressive secondary prevention (risk factor modification) is
recommended.

*** End of Addendum ***
EXAM:
OVER-READ INTERPRETATION  CT CHEST

The following report is an over-read performed by radiologist Dr.
Dellai Isabella [REDACTED] on 12/07/2020. This
over-read does not include interpretation of cardiac or coronary
anatomy or pathology. The coronary CTA interpretation by the
cardiologist is attached.
FINDINGS: Vascular: No significant vascular findings. Normal heart size. No
pericardial effusion.

Mediastinum/Nodes: Visualized mediastinum and hilar regions
demonstrate no lymphadenopathy or masses.

Lungs/Pleura: Mild pulmonary scarring in the inferior right middle
lobe and at the left lung base. Visualized lungs show no evidence of
pulmonary edema, consolidation, pneumothorax, nodule or pleural
fluid.

Upper Abdomen: No acute abnormality.

Musculoskeletal: No chest wall mass or suspicious bone lesions
identified.
IMPRESSION: No significant noncardiac findings. Mild pulmonary scarring in the
inferior right middle lobe and at the left lung base.

## 2022-05-04 DIAGNOSIS — Z800 Family history of malignant neoplasm of digestive organs: Secondary | ICD-10-CM | POA: Diagnosis not present

## 2022-05-04 DIAGNOSIS — Z1211 Encounter for screening for malignant neoplasm of colon: Secondary | ICD-10-CM | POA: Diagnosis not present

## 2022-05-04 DIAGNOSIS — G250 Essential tremor: Secondary | ICD-10-CM | POA: Diagnosis not present

## 2022-05-04 DIAGNOSIS — E785 Hyperlipidemia, unspecified: Secondary | ICD-10-CM | POA: Diagnosis not present

## 2022-05-29 DIAGNOSIS — F330 Major depressive disorder, recurrent, mild: Secondary | ICD-10-CM | POA: Diagnosis not present

## 2022-05-29 DIAGNOSIS — F419 Anxiety disorder, unspecified: Secondary | ICD-10-CM | POA: Diagnosis not present

## 2022-06-15 DIAGNOSIS — M542 Cervicalgia: Secondary | ICD-10-CM | POA: Diagnosis not present

## 2022-06-15 DIAGNOSIS — Z79899 Other long term (current) drug therapy: Secondary | ICD-10-CM | POA: Diagnosis not present

## 2022-06-15 DIAGNOSIS — M5412 Radiculopathy, cervical region: Secondary | ICD-10-CM | POA: Diagnosis not present

## 2022-06-15 DIAGNOSIS — M5459 Other low back pain: Secondary | ICD-10-CM | POA: Diagnosis not present

## 2022-06-15 DIAGNOSIS — Z5181 Encounter for therapeutic drug level monitoring: Secondary | ICD-10-CM | POA: Diagnosis not present

## 2022-06-15 DIAGNOSIS — M5136 Other intervertebral disc degeneration, lumbar region: Secondary | ICD-10-CM | POA: Diagnosis not present

## 2022-07-13 DIAGNOSIS — F330 Major depressive disorder, recurrent, mild: Secondary | ICD-10-CM | POA: Diagnosis not present

## 2022-07-13 DIAGNOSIS — F419 Anxiety disorder, unspecified: Secondary | ICD-10-CM | POA: Diagnosis not present

## 2022-08-03 ENCOUNTER — Other Ambulatory Visit: Payer: Self-pay | Admitting: Internal Medicine

## 2022-08-14 DIAGNOSIS — M5412 Radiculopathy, cervical region: Secondary | ICD-10-CM | POA: Diagnosis not present

## 2022-08-14 DIAGNOSIS — G5602 Carpal tunnel syndrome, left upper limb: Secondary | ICD-10-CM | POA: Diagnosis not present

## 2022-08-14 DIAGNOSIS — M79632 Pain in left forearm: Secondary | ICD-10-CM | POA: Diagnosis not present

## 2022-08-16 DIAGNOSIS — F419 Anxiety disorder, unspecified: Secondary | ICD-10-CM | POA: Diagnosis not present

## 2022-08-16 DIAGNOSIS — F330 Major depressive disorder, recurrent, mild: Secondary | ICD-10-CM | POA: Diagnosis not present

## 2022-09-07 DIAGNOSIS — G5603 Carpal tunnel syndrome, bilateral upper limbs: Secondary | ICD-10-CM | POA: Diagnosis not present

## 2022-09-20 DIAGNOSIS — F330 Major depressive disorder, recurrent, mild: Secondary | ICD-10-CM | POA: Diagnosis not present

## 2022-09-20 DIAGNOSIS — F419 Anxiety disorder, unspecified: Secondary | ICD-10-CM | POA: Diagnosis not present

## 2022-09-27 DIAGNOSIS — Z79891 Long term (current) use of opiate analgesic: Secondary | ICD-10-CM | POA: Diagnosis not present

## 2022-09-27 DIAGNOSIS — M5459 Other low back pain: Secondary | ICD-10-CM | POA: Diagnosis not present

## 2022-09-27 DIAGNOSIS — M961 Postlaminectomy syndrome, not elsewhere classified: Secondary | ICD-10-CM | POA: Diagnosis not present

## 2022-09-27 DIAGNOSIS — M5412 Radiculopathy, cervical region: Secondary | ICD-10-CM | POA: Diagnosis not present

## 2022-09-27 DIAGNOSIS — M542 Cervicalgia: Secondary | ICD-10-CM | POA: Diagnosis not present

## 2022-09-27 DIAGNOSIS — M5136 Other intervertebral disc degeneration, lumbar region: Secondary | ICD-10-CM | POA: Diagnosis not present

## 2022-10-09 DIAGNOSIS — G5603 Carpal tunnel syndrome, bilateral upper limbs: Secondary | ICD-10-CM | POA: Diagnosis not present

## 2022-10-09 DIAGNOSIS — G5602 Carpal tunnel syndrome, left upper limb: Secondary | ICD-10-CM | POA: Diagnosis not present

## 2022-10-19 DIAGNOSIS — Z6826 Body mass index (BMI) 26.0-26.9, adult: Secondary | ICD-10-CM | POA: Diagnosis not present

## 2022-10-19 DIAGNOSIS — Z23 Encounter for immunization: Secondary | ICD-10-CM | POA: Diagnosis not present

## 2022-10-19 DIAGNOSIS — M810 Age-related osteoporosis without current pathological fracture: Secondary | ICD-10-CM | POA: Diagnosis not present

## 2022-11-06 DIAGNOSIS — F419 Anxiety disorder, unspecified: Secondary | ICD-10-CM | POA: Diagnosis not present

## 2022-11-06 DIAGNOSIS — F330 Major depressive disorder, recurrent, mild: Secondary | ICD-10-CM | POA: Diagnosis not present

## 2022-11-24 DIAGNOSIS — D7589 Other specified diseases of blood and blood-forming organs: Secondary | ICD-10-CM | POA: Diagnosis not present

## 2022-11-24 DIAGNOSIS — N1831 Chronic kidney disease, stage 3a: Secondary | ICD-10-CM | POA: Diagnosis not present

## 2022-11-24 DIAGNOSIS — M810 Age-related osteoporosis without current pathological fracture: Secondary | ICD-10-CM | POA: Diagnosis not present

## 2022-11-24 DIAGNOSIS — E785 Hyperlipidemia, unspecified: Secondary | ICD-10-CM | POA: Diagnosis not present

## 2022-11-24 DIAGNOSIS — Z0000 Encounter for general adult medical examination without abnormal findings: Secondary | ICD-10-CM | POA: Diagnosis not present

## 2022-12-15 DIAGNOSIS — E785 Hyperlipidemia, unspecified: Secondary | ICD-10-CM | POA: Diagnosis not present

## 2022-12-15 DIAGNOSIS — M25512 Pain in left shoulder: Secondary | ICD-10-CM | POA: Diagnosis not present

## 2022-12-15 DIAGNOSIS — M79672 Pain in left foot: Secondary | ICD-10-CM | POA: Diagnosis not present

## 2023-01-06 ENCOUNTER — Other Ambulatory Visit: Payer: Self-pay | Admitting: Internal Medicine
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
## 2023-01-08 ENCOUNTER — Encounter: Payer: Self-pay | Admitting: Student

## 2023-01-08 ENCOUNTER — Ambulatory Visit: Payer: PPO | Attending: Physician Assistant | Admitting: Student

## 2023-01-08 VITALS — BP 116/84 | HR 64 | Ht 63.0 in | Wt 144.6 lb

## 2023-01-08 DIAGNOSIS — R002 Palpitations: Secondary | ICD-10-CM | POA: Diagnosis not present

## 2023-01-08 DIAGNOSIS — I2510 Atherosclerotic heart disease of native coronary artery without angina pectoris: Secondary | ICD-10-CM

## 2023-01-08 DIAGNOSIS — E785 Hyperlipidemia, unspecified: Secondary | ICD-10-CM | POA: Diagnosis not present

## 2023-01-08 DIAGNOSIS — R0789 Other chest pain: Secondary | ICD-10-CM | POA: Diagnosis not present

## 2023-01-08 NOTE — Patient Instructions (Signed)
Medication Instructions:  Your physician recommends that you continue on your current medications as directed. Please refer to the Current Medication list given to you today.  *If you need a refill on your cardiac medications before your next appointment, please call your pharmacy*   Lab Work: NONE ordered at this time of appointment   If you have labs (blood work) drawn today and your tests are completely normal, you will receive your results only by: [place] (if you have MyChart) OR A paper copy in the mail If you have any lab test that is abnormal or we need to change your treatment, we will call you to review the results.   Testing/Procedures: NONE ordered at this time of appointment   Follow-Up: At [facility], you and your health needs are our priority.  As part of our continuing mission to provide you with exceptional heart care, we have created designated Provider Care Teams.  These Care Teams include your primary Cardiologist (physician) and Advanced Practice Providers (APPs -  Physician Assistants and Nurse Practitioners) who all work together to provide you with the care you need, when you need it.  We recommend signing up for the patient portal called "MyChart".  Sign up information is provided on this After Visit Summary.  MyChart is used to connect with patients for Virtual Visits (Telemedicine).  Patients are able to view lab/test results, encounter notes, upcoming appointments, etc.  Non-urgent messages can be sent to your provider as well.   To learn more about what you can do with MyChart, go to [email or web address].    Your next appointment:   12 month(s)  Provider:   Pixie Casino, MD     Other Instructions

## 2023-01-15 DIAGNOSIS — M25512 Pain in left shoulder: Secondary | ICD-10-CM | POA: Diagnosis not present

## 2023-01-29 DIAGNOSIS — M5136 Other intervertebral disc degeneration, lumbar region: Secondary | ICD-10-CM | POA: Diagnosis not present

## 2023-01-29 DIAGNOSIS — M5459 Other low back pain: Secondary | ICD-10-CM | POA: Diagnosis not present

## 2023-01-29 DIAGNOSIS — Z79891 Long term (current) use of opiate analgesic: Secondary | ICD-10-CM | POA: Diagnosis not present

## 2023-01-29 DIAGNOSIS — M542 Cervicalgia: Secondary | ICD-10-CM | POA: Diagnosis not present

## 2023-02-19 ENCOUNTER — Other Ambulatory Visit: Payer: Self-pay | Admitting: Internal Medicine

## 2023-04-15 ENCOUNTER — Other Ambulatory Visit: Payer: Self-pay | Admitting: Internal Medicine

## 2023-05-29 DIAGNOSIS — M810 Age-related osteoporosis without current pathological fracture: Secondary | ICD-10-CM | POA: Diagnosis not present

## 2023-05-29 DIAGNOSIS — N1831 Chronic kidney disease, stage 3a: Secondary | ICD-10-CM | POA: Diagnosis not present

## 2023-05-29 DIAGNOSIS — K047 Periapical abscess without sinus: Secondary | ICD-10-CM | POA: Diagnosis not present

## 2023-05-29 DIAGNOSIS — E785 Hyperlipidemia, unspecified: Secondary | ICD-10-CM | POA: Diagnosis not present

## 2023-05-29 DIAGNOSIS — G250 Essential tremor: Secondary | ICD-10-CM | POA: Diagnosis not present

## 2023-05-29 DIAGNOSIS — G4700 Insomnia, unspecified: Secondary | ICD-10-CM | POA: Diagnosis not present

## 2023-06-11 DIAGNOSIS — J029 Acute pharyngitis, unspecified: Secondary | ICD-10-CM | POA: Diagnosis not present

## 2023-06-11 DIAGNOSIS — Z0000 Encounter for general adult medical examination without abnormal findings: Secondary | ICD-10-CM | POA: Diagnosis not present

## 2023-06-11 DIAGNOSIS — U071 COVID-19: Secondary | ICD-10-CM | POA: Diagnosis not present

## 2023-06-11 DIAGNOSIS — Z6825 Body mass index (BMI) 25.0-25.9, adult: Secondary | ICD-10-CM | POA: Diagnosis not present

## 2023-06-11 DIAGNOSIS — J069 Acute upper respiratory infection, unspecified: Secondary | ICD-10-CM | POA: Diagnosis not present

## 2023-06-11 DIAGNOSIS — Z03818 Encounter for observation for suspected exposure to other biological agents ruled out: Secondary | ICD-10-CM | POA: Diagnosis not present

## 2023-06-27 DIAGNOSIS — Z79899 Other long term (current) drug therapy: Secondary | ICD-10-CM | POA: Diagnosis not present

## 2023-06-27 DIAGNOSIS — M5412 Radiculopathy, cervical region: Secondary | ICD-10-CM | POA: Diagnosis not present

## 2023-06-27 DIAGNOSIS — M5136 Other intervertebral disc degeneration, lumbar region: Secondary | ICD-10-CM | POA: Diagnosis not present

## 2023-06-27 DIAGNOSIS — Z79891 Long term (current) use of opiate analgesic: Secondary | ICD-10-CM | POA: Diagnosis not present

## 2023-06-27 DIAGNOSIS — Z5181 Encounter for therapeutic drug level monitoring: Secondary | ICD-10-CM | POA: Diagnosis not present

## 2023-06-27 DIAGNOSIS — M5459 Other low back pain: Secondary | ICD-10-CM | POA: Diagnosis not present

## 2023-06-27 DIAGNOSIS — M542 Cervicalgia: Secondary | ICD-10-CM | POA: Diagnosis not present

## 2023-08-19 ENCOUNTER — Other Ambulatory Visit: Payer: Self-pay | Admitting: Internal Medicine

## 2023-09-20 ENCOUNTER — Telehealth: Payer: Self-pay | Admitting: Internal Medicine

## 2023-09-20 NOTE — Telephone Encounter (Signed)
Pt asked for nurse to call her back. She has some questions about her medical history

## 2023-09-20 NOTE — Telephone Encounter (Signed)
Patient called to set up appointment with Dr. Rennis Golden. He is booked out until March. She is scheduled with Dr. Rennis Golden. Did offer appointment with APP. She declined

## 2023-11-17 DIAGNOSIS — Z79891 Long term (current) use of opiate analgesic: Secondary | ICD-10-CM | POA: Diagnosis not present

## 2023-11-17 DIAGNOSIS — M961 Postlaminectomy syndrome, not elsewhere classified: Secondary | ICD-10-CM | POA: Diagnosis not present

## 2023-11-17 DIAGNOSIS — M5412 Radiculopathy, cervical region: Secondary | ICD-10-CM | POA: Diagnosis not present

## 2023-11-17 DIAGNOSIS — M51369 Other intervertebral disc degeneration, lumbar region without mention of lumbar back pain or lower extremity pain: Secondary | ICD-10-CM | POA: Diagnosis not present

## 2023-11-17 DIAGNOSIS — M5459 Other low back pain: Secondary | ICD-10-CM | POA: Diagnosis not present

## 2023-11-27 DIAGNOSIS — R7309 Other abnormal glucose: Secondary | ICD-10-CM | POA: Diagnosis not present

## 2023-11-27 DIAGNOSIS — M810 Age-related osteoporosis without current pathological fracture: Secondary | ICD-10-CM | POA: Diagnosis not present

## 2023-11-27 DIAGNOSIS — D7589 Other specified diseases of blood and blood-forming organs: Secondary | ICD-10-CM | POA: Diagnosis not present

## 2023-11-27 DIAGNOSIS — G4700 Insomnia, unspecified: Secondary | ICD-10-CM | POA: Diagnosis not present

## 2023-11-27 DIAGNOSIS — E785 Hyperlipidemia, unspecified: Secondary | ICD-10-CM | POA: Diagnosis not present

## 2023-11-27 DIAGNOSIS — Z0000 Encounter for general adult medical examination without abnormal findings: Secondary | ICD-10-CM | POA: Diagnosis not present

## 2023-11-27 DIAGNOSIS — N1831 Chronic kidney disease, stage 3a: Secondary | ICD-10-CM | POA: Diagnosis not present

## 2023-11-27 DIAGNOSIS — G250 Essential tremor: Secondary | ICD-10-CM | POA: Diagnosis not present

## 2023-11-27 DIAGNOSIS — Z23 Encounter for immunization: Secondary | ICD-10-CM | POA: Diagnosis not present

## 2023-12-18 ENCOUNTER — Other Ambulatory Visit: Payer: Self-pay | Admitting: Internal Medicine

## 2023-12-24 ENCOUNTER — Other Ambulatory Visit: Payer: Self-pay | Admitting: Internal Medicine

## 2024-03-13 ENCOUNTER — Ambulatory Visit: Payer: PPO | Attending: Internal Medicine | Admitting: Internal Medicine

## 2024-03-13 ENCOUNTER — Encounter: Payer: Self-pay | Admitting: Internal Medicine

## 2024-03-13 VITALS — BP 122/64 | HR 65 | Ht 65.0 in | Wt 127.0 lb

## 2024-03-13 DIAGNOSIS — R002 Palpitations: Secondary | ICD-10-CM | POA: Diagnosis not present

## 2024-03-13 DIAGNOSIS — I2510 Atherosclerotic heart disease of native coronary artery without angina pectoris: Secondary | ICD-10-CM

## 2024-03-13 DIAGNOSIS — E785 Hyperlipidemia, unspecified: Secondary | ICD-10-CM | POA: Diagnosis not present

## 2024-03-13 NOTE — Patient Instructions (Signed)
 Medication Instructions:   No changes *If you need a refill on your cardiac medications before your next appointment, please call your pharmacy*   Lab Work: Not needed .   Testing/Procedures:  Not needed  Follow-Up: At [facility] you and your health needs are our priority.  As part of our continuing mission to provide you with exceptional heart care, we have created designated Provider Care Teams.  These Care Teams include your primary Cardiologist (physician) and Advanced Practice Providers (APPs -  Physician Assistants and Nurse Practitioners) who all work together to provide you with the care you need, when you need it.     Your next appointment:   12 month(s)  The format for your next appointment:   In Person  Provider:   Chrystie Nose, MD    Other Instructions   Ms.s
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
## 2024-03-17 DIAGNOSIS — M51369 Other intervertebral disc degeneration, lumbar region without mention of lumbar back pain or lower extremity pain: Secondary | ICD-10-CM | POA: Diagnosis not present

## 2024-03-17 DIAGNOSIS — M5412 Radiculopathy, cervical region: Secondary | ICD-10-CM | POA: Diagnosis not present

## 2024-03-17 DIAGNOSIS — M961 Postlaminectomy syndrome, not elsewhere classified: Secondary | ICD-10-CM | POA: Diagnosis not present

## 2024-03-24 ENCOUNTER — Other Ambulatory Visit: Payer: Self-pay | Admitting: Internal Medicine

## 2024-05-20 DIAGNOSIS — M816 Localized osteoporosis [Lequesne]: Secondary | ICD-10-CM | POA: Diagnosis not present

## 2024-05-20 DIAGNOSIS — R61 Generalized hyperhidrosis: Secondary | ICD-10-CM | POA: Diagnosis not present

## 2024-05-20 DIAGNOSIS — Z1239 Encounter for other screening for malignant neoplasm of breast: Secondary | ICD-10-CM | POA: Diagnosis not present

## 2024-05-20 DIAGNOSIS — Z1330 Encounter for screening examination for mental health and behavioral disorders, unspecified: Secondary | ICD-10-CM | POA: Diagnosis not present

## 2024-05-20 DIAGNOSIS — Z7689 Persons encountering health services in other specified circumstances: Secondary | ICD-10-CM | POA: Diagnosis not present

## 2024-05-20 DIAGNOSIS — E7800 Pure hypercholesterolemia, unspecified: Secondary | ICD-10-CM | POA: Diagnosis not present

## 2024-05-20 DIAGNOSIS — Z1211 Encounter for screening for malignant neoplasm of colon: Secondary | ICD-10-CM | POA: Diagnosis not present

## 2024-05-21 ENCOUNTER — Other Ambulatory Visit: Payer: Self-pay | Admitting: Internal Medicine

## 2024-06-05 DIAGNOSIS — Z1211 Encounter for screening for malignant neoplasm of colon: Secondary | ICD-10-CM | POA: Diagnosis not present

## 2024-06-12 LAB — COLOGUARD: COLOGUARD: NEGATIVE

## 2024-06-23 DIAGNOSIS — Z7689 Persons encountering health services in other specified circumstances: Secondary | ICD-10-CM | POA: Diagnosis not present

## 2024-06-23 DIAGNOSIS — Z1239 Encounter for other screening for malignant neoplasm of breast: Secondary | ICD-10-CM | POA: Diagnosis not present

## 2024-06-23 DIAGNOSIS — Z1211 Encounter for screening for malignant neoplasm of colon: Secondary | ICD-10-CM | POA: Diagnosis not present

## 2024-06-23 DIAGNOSIS — R61 Generalized hyperhidrosis: Secondary | ICD-10-CM | POA: Diagnosis not present

## 2024-06-23 DIAGNOSIS — M816 Localized osteoporosis [Lequesne]: Secondary | ICD-10-CM | POA: Diagnosis not present

## 2024-06-23 DIAGNOSIS — E7800 Pure hypercholesterolemia, unspecified: Secondary | ICD-10-CM | POA: Diagnosis not present

## 2024-06-26 DIAGNOSIS — M5451 Vertebrogenic low back pain: Secondary | ICD-10-CM | POA: Diagnosis not present

## 2024-07-01 DIAGNOSIS — H2511 Age-related nuclear cataract, right eye: Secondary | ICD-10-CM | POA: Diagnosis not present

## 2024-07-01 DIAGNOSIS — H2513 Age-related nuclear cataract, bilateral: Secondary | ICD-10-CM | POA: Diagnosis not present

## 2024-07-01 DIAGNOSIS — H25013 Cortical age-related cataract, bilateral: Secondary | ICD-10-CM | POA: Diagnosis not present

## 2024-07-01 DIAGNOSIS — H18413 Arcus senilis, bilateral: Secondary | ICD-10-CM | POA: Diagnosis not present

## 2024-07-01 DIAGNOSIS — H25043 Posterior subcapsular polar age-related cataract, bilateral: Secondary | ICD-10-CM | POA: Diagnosis not present

## 2024-07-17 DIAGNOSIS — M51369 Other intervertebral disc degeneration, lumbar region without mention of lumbar back pain or lower extremity pain: Secondary | ICD-10-CM | POA: Diagnosis not present

## 2024-07-17 DIAGNOSIS — M48061 Spinal stenosis, lumbar region without neurogenic claudication: Secondary | ICD-10-CM | POA: Diagnosis not present

## 2024-07-17 DIAGNOSIS — M810 Age-related osteoporosis without current pathological fracture: Secondary | ICD-10-CM | POA: Diagnosis not present

## 2024-07-17 DIAGNOSIS — M542 Cervicalgia: Secondary | ICD-10-CM | POA: Diagnosis not present

## 2024-07-17 DIAGNOSIS — Z79899 Other long term (current) drug therapy: Secondary | ICD-10-CM | POA: Diagnosis not present

## 2024-07-17 DIAGNOSIS — M961 Postlaminectomy syndrome, not elsewhere classified: Secondary | ICD-10-CM | POA: Diagnosis not present

## 2024-07-24 DIAGNOSIS — M816 Localized osteoporosis [Lequesne]: Secondary | ICD-10-CM | POA: Diagnosis not present

## 2024-07-24 DIAGNOSIS — Z1239 Encounter for other screening for malignant neoplasm of breast: Secondary | ICD-10-CM | POA: Diagnosis not present

## 2024-07-24 DIAGNOSIS — Z7689 Persons encountering health services in other specified circumstances: Secondary | ICD-10-CM | POA: Diagnosis not present

## 2024-07-24 DIAGNOSIS — E7800 Pure hypercholesterolemia, unspecified: Secondary | ICD-10-CM | POA: Diagnosis not present

## 2024-07-24 DIAGNOSIS — Z1211 Encounter for screening for malignant neoplasm of colon: Secondary | ICD-10-CM | POA: Diagnosis not present

## 2024-07-24 DIAGNOSIS — R61 Generalized hyperhidrosis: Secondary | ICD-10-CM | POA: Diagnosis not present

## 2024-08-12 DIAGNOSIS — R52 Pain, unspecified: Secondary | ICD-10-CM | POA: Diagnosis not present

## 2024-08-12 DIAGNOSIS — R5383 Other fatigue: Secondary | ICD-10-CM | POA: Diagnosis not present

## 2024-08-12 DIAGNOSIS — J029 Acute pharyngitis, unspecified: Secondary | ICD-10-CM | POA: Diagnosis not present

## 2024-08-19 DIAGNOSIS — J029 Acute pharyngitis, unspecified: Secondary | ICD-10-CM | POA: Diagnosis not present

## 2024-08-22 DIAGNOSIS — Z1239 Encounter for other screening for malignant neoplasm of breast: Secondary | ICD-10-CM | POA: Diagnosis not present

## 2024-08-22 DIAGNOSIS — E7800 Pure hypercholesterolemia, unspecified: Secondary | ICD-10-CM | POA: Diagnosis not present

## 2024-08-22 DIAGNOSIS — Z7689 Persons encountering health services in other specified circumstances: Secondary | ICD-10-CM | POA: Diagnosis not present

## 2024-08-22 DIAGNOSIS — R61 Generalized hyperhidrosis: Secondary | ICD-10-CM | POA: Diagnosis not present

## 2024-08-22 DIAGNOSIS — M816 Localized osteoporosis [Lequesne]: Secondary | ICD-10-CM | POA: Diagnosis not present

## 2024-08-22 DIAGNOSIS — Z1211 Encounter for screening for malignant neoplasm of colon: Secondary | ICD-10-CM | POA: Diagnosis not present

## 2024-09-05 DIAGNOSIS — M5441 Lumbago with sciatica, right side: Secondary | ICD-10-CM | POA: Diagnosis not present

## 2024-09-05 DIAGNOSIS — M961 Postlaminectomy syndrome, not elsewhere classified: Secondary | ICD-10-CM | POA: Diagnosis not present

## 2024-09-05 DIAGNOSIS — M5126 Other intervertebral disc displacement, lumbar region: Secondary | ICD-10-CM | POA: Diagnosis not present

## 2024-09-05 DIAGNOSIS — R61 Generalized hyperhidrosis: Secondary | ICD-10-CM | POA: Diagnosis not present

## 2024-09-05 DIAGNOSIS — Z780 Asymptomatic menopausal state: Secondary | ICD-10-CM | POA: Diagnosis not present

## 2024-09-05 DIAGNOSIS — G8929 Other chronic pain: Secondary | ICD-10-CM | POA: Diagnosis not present

## 2024-09-15 DIAGNOSIS — H2511 Age-related nuclear cataract, right eye: Secondary | ICD-10-CM | POA: Diagnosis not present

## 2024-09-16 DIAGNOSIS — H25012 Cortical age-related cataract, left eye: Secondary | ICD-10-CM | POA: Diagnosis not present

## 2024-09-16 DIAGNOSIS — H25042 Posterior subcapsular polar age-related cataract, left eye: Secondary | ICD-10-CM | POA: Diagnosis not present

## 2024-09-16 DIAGNOSIS — H2512 Age-related nuclear cataract, left eye: Secondary | ICD-10-CM | POA: Diagnosis not present

## 2024-09-23 DIAGNOSIS — E7800 Pure hypercholesterolemia, unspecified: Secondary | ICD-10-CM | POA: Diagnosis not present

## 2024-09-23 DIAGNOSIS — M816 Localized osteoporosis [Lequesne]: Secondary | ICD-10-CM | POA: Diagnosis not present

## 2024-09-23 DIAGNOSIS — Z1239 Encounter for other screening for malignant neoplasm of breast: Secondary | ICD-10-CM | POA: Diagnosis not present

## 2024-09-23 DIAGNOSIS — Z1211 Encounter for screening for malignant neoplasm of colon: Secondary | ICD-10-CM | POA: Diagnosis not present

## 2024-09-23 DIAGNOSIS — R61 Generalized hyperhidrosis: Secondary | ICD-10-CM | POA: Diagnosis not present

## 2024-09-23 DIAGNOSIS — Z7689 Persons encountering health services in other specified circumstances: Secondary | ICD-10-CM | POA: Diagnosis not present

## 2024-10-17 DIAGNOSIS — E7800 Pure hypercholesterolemia, unspecified: Secondary | ICD-10-CM | POA: Diagnosis not present

## 2024-10-17 DIAGNOSIS — Z1239 Encounter for other screening for malignant neoplasm of breast: Secondary | ICD-10-CM | POA: Diagnosis not present

## 2024-10-17 DIAGNOSIS — Z1211 Encounter for screening for malignant neoplasm of colon: Secondary | ICD-10-CM | POA: Diagnosis not present

## 2024-10-17 DIAGNOSIS — M816 Localized osteoporosis [Lequesne]: Secondary | ICD-10-CM | POA: Diagnosis not present

## 2024-10-17 DIAGNOSIS — R61 Generalized hyperhidrosis: Secondary | ICD-10-CM | POA: Diagnosis not present

## 2024-10-17 DIAGNOSIS — Z7689 Persons encountering health services in other specified circumstances: Secondary | ICD-10-CM | POA: Diagnosis not present

## 2024-11-17 DIAGNOSIS — M48061 Spinal stenosis, lumbar region without neurogenic claudication: Secondary | ICD-10-CM | POA: Diagnosis not present

## 2024-11-17 DIAGNOSIS — Z5181 Encounter for therapeutic drug level monitoring: Secondary | ICD-10-CM | POA: Diagnosis not present

## 2024-12-15 ENCOUNTER — Other Ambulatory Visit: Payer: Self-pay | Admitting: Internal Medicine

## 2024-12-15 DIAGNOSIS — G250 Essential tremor: Secondary | ICD-10-CM

## 2024-12-16 NOTE — Telephone Encounter (Signed)
Ok to refill under my name.

## 2024-12-16 NOTE — Telephone Encounter (Signed)
 Pt of Dr. Mona. This RX has was not prescribed or refilled by Dr. Mona. Does Dr. Mona want to refill? Please advise.
